# Patient Record
Sex: Male | Born: 1965 | Race: White | Hispanic: No | Marital: Married | Smoking: Never smoker
Health system: Southern US, Community
[De-identification: ages and names within clinical notes are randomized; demographics above are authoritative.]

## PROBLEM LIST (undated history)

## (undated) DIAGNOSIS — Z9889 Other specified postprocedural states: Secondary | ICD-10-CM

## (undated) DIAGNOSIS — R112 Nausea with vomiting, unspecified: Secondary | ICD-10-CM

## (undated) HISTORY — PX: WISDOM TOOTH EXTRACTION: SHX21

## (undated) HISTORY — PX: PILONIDAL CYST EXCISION: SHX744

---

## 2013-09-18 DIAGNOSIS — N508 Other specified disorders of male genital organs: Secondary | ICD-10-CM | POA: Insufficient documentation

## 2013-09-18 DIAGNOSIS — R350 Frequency of micturition: Secondary | ICD-10-CM | POA: Insufficient documentation

## 2013-09-18 DIAGNOSIS — N138 Other obstructive and reflux uropathy: Secondary | ICD-10-CM | POA: Insufficient documentation

## 2013-09-18 DIAGNOSIS — N411 Chronic prostatitis: Secondary | ICD-10-CM | POA: Insufficient documentation

## 2015-09-11 ENCOUNTER — Ambulatory Visit (INDEPENDENT_AMBULATORY_CARE_PROVIDER_SITE_OTHER): Payer: BC Managed Care – PPO | Admitting: Family Medicine

## 2015-09-11 ENCOUNTER — Encounter: Payer: Self-pay | Admitting: Family Medicine

## 2015-09-11 VITALS — BP 120/80 | HR 78 | Ht 69.0 in | Wt 169.0 lb

## 2015-09-11 DIAGNOSIS — Z13220 Encounter for screening for lipoid disorders: Secondary | ICD-10-CM

## 2015-09-11 DIAGNOSIS — Z0000 Encounter for general adult medical examination without abnormal findings: Secondary | ICD-10-CM | POA: Diagnosis not present

## 2015-09-11 LAB — POCT URINALYSIS DIPSTICK
Bilirubin, UA: NEGATIVE
GLUCOSE UA: NEGATIVE
Ketones, UA: NEGATIVE
Leukocytes, UA: NEGATIVE
Nitrite, UA: NEGATIVE
Protein, UA: NEGATIVE
RBC UA: NEGATIVE
UROBILINOGEN UA: 0.2
pH, UA: 5

## 2015-09-11 NOTE — Progress Notes (Signed)
Name: Garrett Barnes   MRN: [id number]    DOB: November 30, 1965   Date:09/11/2015       Progress Note  Subjective  Chief Complaint  Chief Complaint  Patient presents with  . Annual Exam    HPI Comments: Patient presents for annual physical exam.     No problem-specific assessment & plan notes found for this encounter.   History reviewed. No pertinent past medical history.  Past Surgical History  Procedure Laterality Date  . Wisdom tooth extraction    . Pilonidal cyst excision      No family history on file.  Social History   Social History  . Marital Status: Unknown    Spouse Name: N/A  . Number of Children: N/A  . Years of Education: N/A   Occupational History  . Not on file.   Social History Main Topics  . Smoking status: Never Smoker   . Smokeless tobacco: Not on file  . Alcohol Use: No  . Drug Use: No  . Sexual Activity: Not on file   Other Topics Concern  . Not on file   Social History Narrative  . No narrative on file    Allergies  Allergen Reactions  . Penicillins Rash    Diarrhea     Review of Systems  Constitutional: Negative for fever, chills, weight loss and malaise/fatigue.  HENT: Negative for ear discharge, ear pain and sore throat.   Eyes: Negative for blurred vision.  Respiratory: Negative for cough, sputum production, shortness of breath and wheezing.   Cardiovascular: Negative for chest pain, palpitations and leg swelling.  Gastrointestinal: Negative for heartburn, nausea, abdominal pain, diarrhea, constipation, blood in stool and melena.  Genitourinary: Negative for dysuria, urgency, frequency and hematuria.  Musculoskeletal: Negative for myalgias, back pain, joint pain and neck pain.  Skin: Negative for rash.  Neurological: Negative for dizziness, tingling, sensory change, focal weakness and headaches.  Endo/Heme/Allergies: Negative for environmental allergies and polydipsia. Does not bruise/bleed easily.  Psychiatric/Behavioral:  Negative for depression and suicidal ideas. The patient is not nervous/anxious and does not have insomnia.      Objective  Filed Vitals:   09/11/15 0954  BP: 120/80  Pulse: 78  Height: 5\' 9"  (1.753 m)  Weight: 169 lb (76.658 kg)    Physical Exam  Constitutional: He is oriented to person, place, and time and well-developed, well-nourished, and in no distress.  HENT:  Head: Normocephalic.  Right Ear: External ear normal.  Left Ear: External ear normal.  Nose: Nose normal.  Mouth/Throat: Oropharynx is clear and moist.  Eyes: Conjunctivae and EOM are normal. Pupils are equal, round, and reactive to light. Right eye exhibits no discharge. Left eye exhibits no discharge. No scleral icterus.  Neck: Normal range of motion. Neck supple. No JVD present. No tracheal deviation present. No thyromegaly present.  Cardiovascular: Normal rate, regular rhythm, normal heart sounds and intact distal pulses.  Exam reveals no gallop and no friction rub.   No murmur heard. Pulmonary/Chest: Breath sounds normal. No respiratory distress. He has no wheezes. He has no rales.  Abdominal: Soft. Bowel sounds are normal. He exhibits no mass. There is no hepatosplenomegaly. There is no tenderness. There is no rebound, no guarding and no CVA tenderness.  Musculoskeletal: Normal range of motion. He exhibits no edema or tenderness.  Lymphadenopathy:    He has no cervical adenopathy.  Neurological: He is alert and oriented to person, place, and time. He has normal sensation, normal strength, normal reflexes and intact cranial  nerves. No cranial nerve deficit.  Skin: Skin is warm. No rash noted.  Psychiatric: Mood and affect normal.  Nursing note and vitals reviewed.     Assessment & Plan  Problem List Items Addressed This Visit    None    Visit Diagnoses    Annual physical exam    -  Primary    Relevant Orders    Lipid Profile    Renal Function Panel    POCT urinalysis dipstick (Completed)    Screening  cholesterol level        Relevant Orders    Lipid Profile         Dr. Otilio Miu [facility]  09/11/2015

## 2015-09-12 LAB — RENAL FUNCTION PANEL
Albumin: 4.4 g/dL (ref 3.5–5.5)
BUN / CREAT RATIO: 11 (ref 9–20)
BUN: 10 mg/dL (ref 6–24)
CO2: 24 mmol/L (ref 18–29)
CREATININE: 0.92 mg/dL (ref 0.76–1.27)
Calcium: 9.6 mg/dL (ref 8.7–10.2)
Chloride: 98 mmol/L (ref 96–106)
GFR, EST AFRICAN AMERICAN: 114 mL/min/{1.73_m2} (ref >59)
GFR, EST NON AFRICAN AMERICAN: 99 mL/min/{1.73_m2} (ref >59)
Glucose: 70 mg/dL (ref 65–99)
Phosphorus: 3.4 mg/dL (ref 2.5–4.5)
Potassium: 4.3 mmol/L (ref 3.5–5.2)
Sodium: 140 mmol/L (ref 134–144)

## 2015-09-12 LAB — LIPID PANEL
Chol/HDL Ratio: 3.8 ratio units (ref 0.0–5.0)
Cholesterol, Total: 190 mg/dL (ref 100–199)
HDL: 50 mg/dL (ref >39)
LDL CALC: 109 mg/dL — AB (ref 0–99)
TRIGLYCERIDES: 154 mg/dL — AB (ref 0–149)
VLDL Cholesterol Cal: 31 mg/dL (ref 5–40)

## 2016-03-05 ENCOUNTER — Ambulatory Visit
Admission: RE | Admit: 2016-03-05 | Discharge: 2016-03-05 | Disposition: A | Discharge status: Home / Self Care | Payer: BC Managed Care – PPO | Source: Ambulatory Visit | Attending: Family Medicine | Admitting: Family Medicine

## 2016-03-05 ENCOUNTER — Other Ambulatory Visit: Payer: Self-pay | Admitting: *Deleted

## 2016-03-05 ENCOUNTER — Encounter: Payer: Self-pay | Admitting: Family Medicine

## 2016-03-05 ENCOUNTER — Ambulatory Visit (INDEPENDENT_AMBULATORY_CARE_PROVIDER_SITE_OTHER): Payer: BC Managed Care – PPO | Admitting: Family Medicine

## 2016-03-05 VITALS — BP 122/86 | HR 84 | Temp 98.2°F | Wt 175.0 lb

## 2016-03-05 DIAGNOSIS — R0781 Pleurodynia: Secondary | ICD-10-CM

## 2016-03-05 DIAGNOSIS — S29011A Strain of muscle and tendon of front wall of thorax, initial encounter: Secondary | ICD-10-CM | POA: Insufficient documentation

## 2016-03-05 MED ORDER — ETODOLAC 500 MG PO TABS: 500.0000 mg | ORAL_TABLET | Freq: Two times a day (BID) | ORAL | 0 refills | Status: DC

## 2016-03-05 NOTE — Progress Notes (Signed)
Name: Garrett Barnes   MRN: [id number]    DOB: 06-26-1965   Date:03/05/2016       Progress Note  Subjective  Chief Complaint  Chief Complaint  Patient presents with  . Abdominal Pain    pt stated having abdominal lt side for about 1 month    Abdominal Pain  This is a recurrent problem. The current episode started more than 1 month ago. The onset quality is gradual. The problem occurs intermittently. The problem has been waxing and waning. The pain is located in the epigastric region and LUQ. The pain is at a severity of 3/10. The pain is mild. The quality of the pain is aching. The abdominal pain does not radiate. Associated symptoms include belching, constipation and diarrhea. Pertinent negatives include no dysuria, fever, frequency, headaches, hematochezia, hematuria, melena, myalgias, nausea, vomiting or weight loss. The pain is aggravated by certain positions. The pain is relieved by bowel movements. He has tried nothing for the symptoms. The treatment provided no relief. His past medical history is significant for irritable bowel syndrome.    No problem-specific Assessment & Plan notes found for this encounter.   No past medical history on file.  Past Surgical History:  Procedure Laterality Date  . PILONIDAL CYST EXCISION    . WISDOM TOOTH EXTRACTION      No family history on file.  Social History   Social History  . Marital status: Unknown    Spouse name: N/A  . Number of children: N/A  . Years of education: N/A   Occupational History  . Not on file.   Social History Main Topics  . Smoking status: Never Smoker  . Smokeless tobacco: Not on file  . Alcohol use No  . Drug use: No  . Sexual activity: Not on file   Other Topics Concern  . Not on file   Social History Narrative  . No narrative on file    Allergies  Allergen Reactions  . Penicillins Rash    Diarrhea     Review of Systems  Constitutional: Negative for chills, fever, malaise/fatigue and weight  loss.  HENT: Negative for ear discharge, ear pain and sore throat.   Eyes: Negative for blurred vision.  Respiratory: Negative for cough, sputum production, shortness of breath and wheezing.   Cardiovascular: Negative for chest pain, palpitations and leg swelling.  Gastrointestinal: Positive for abdominal pain, constipation and diarrhea. Negative for blood in stool, heartburn, hematochezia, melena, nausea and vomiting.  Genitourinary: Negative for dysuria, frequency, hematuria and urgency.  Musculoskeletal: Negative for back pain, joint pain, myalgias and neck pain.  Skin: Negative for rash.  Neurological: Negative for dizziness, tingling, sensory change, focal weakness and headaches.  Endo/Heme/Allergies: Negative for environmental allergies and polydipsia. Does not bruise/bleed easily.  Psychiatric/Behavioral: Negative for depression and suicidal ideas. The patient is not nervous/anxious and does not have insomnia.      Objective  Vitals:   03/05/16 1421  BP: 122/86  Pulse: 84  Temp: 98.2 F (36.8 C)  SpO2: 99%  Weight: 175 lb (79.4 kg)    Physical Exam  Constitutional: He is oriented to person, place, and time and well-developed, well-nourished, and in no distress.  HENT:  Head: Normocephalic.  Right Ear: External ear normal.  Left Ear: External ear normal.  Nose: Nose normal.  Mouth/Throat: Oropharynx is clear and moist.  Eyes: Conjunctivae and EOM are normal. Pupils are equal, round, and reactive to light. Right eye exhibits no discharge. Left eye exhibits no discharge.  No scleral icterus.  Neck: Normal range of motion. Neck supple. No JVD present. No tracheal deviation present. No thyromegaly present.  Cardiovascular: Normal rate, regular rhythm, normal heart sounds and intact distal pulses.  Exam reveals no gallop and no friction rub.   No murmur heard. Pulmonary/Chest: Breath sounds normal. No respiratory distress. He has no wheezes. He has no rales.  Abdominal: Soft.  Bowel sounds are normal. He exhibits no mass. There is no hepatosplenomegaly. There is no tenderness. There is no rebound, no guarding and no CVA tenderness.  Musculoskeletal: Normal range of motion. He exhibits no edema or tenderness.       Arms: Tenderness/intercostal ?9th/10 th rib area  Lymphadenopathy:    He has no cervical adenopathy.  Neurological: He is alert and oriented to person, place, and time. He has normal sensation, normal strength and intact cranial nerves.  Skin: Skin is warm. No rash noted.  Psychiatric: Mood and affect normal.  Nursing note and vitals reviewed.     Assessment & Plan  Problem List Items Addressed This Visit    None    Visit Diagnoses    Intercostal muscle strain, initial encounter    -  Primary   Relevant Orders   DG Ribs Unilateral Left (Completed)   Rib pain on left side       Relevant Orders   DG Ribs Unilateral Left (Completed)        Dr. Deanna Jones [facility]  03/05/16

## 2016-11-06 ENCOUNTER — Encounter: Payer: BC Managed Care – PPO | Admitting: Family Medicine

## 2016-11-06 ENCOUNTER — Other Ambulatory Visit: Payer: BC Managed Care – PPO

## 2016-11-06 DIAGNOSIS — Z13220 Encounter for screening for lipoid disorders: Secondary | ICD-10-CM

## 2016-11-23 ENCOUNTER — Other Ambulatory Visit: Payer: Self-pay | Admitting: Family Medicine

## 2017-02-02 DIAGNOSIS — Z8042 Family history of malignant neoplasm of prostate: Secondary | ICD-10-CM | POA: Insufficient documentation

## 2017-02-03 DIAGNOSIS — R339 Retention of urine, unspecified: Secondary | ICD-10-CM | POA: Insufficient documentation

## 2017-06-07 ENCOUNTER — Ambulatory Visit: Payer: BC Managed Care – PPO | Admitting: Family Medicine

## 2017-06-07 ENCOUNTER — Other Ambulatory Visit: Payer: Self-pay

## 2017-06-07 ENCOUNTER — Encounter: Payer: Self-pay | Admitting: Family Medicine

## 2017-06-07 VITALS — BP 100/88 | HR 76 | Ht 68.0 in | Wt 174.0 lb

## 2017-06-07 DIAGNOSIS — J0100 Acute maxillary sinusitis, unspecified: Secondary | ICD-10-CM | POA: Diagnosis not present

## 2017-06-07 MED ORDER — GUAIFENESIN-CODEINE 100-10 MG/5ML PO SYRP: 5.0000 mL | ORAL_SOLUTION | Freq: Three times a day (TID) | ORAL | 0 refills | Status: DC | PRN

## 2017-06-07 MED ORDER — AZITHROMYCIN 250 MG PO TABS: ORAL_TABLET | ORAL | 0 refills | Status: DC

## 2017-06-07 NOTE — Progress Notes (Signed)
Name: Garrett Barnes   MRN: [id number]    DOB: 1965/07/28   Date:06/07/2017       Progress Note  Subjective  Chief Complaint  Chief Complaint  Patient presents with  . Sinusitis    had a cold x 1 week- mild fever, headache, sinus pressure, cough    Sinusitis  This is a new problem. The current episode started in the past 7 days. The problem has been gradually improving since onset. There has been no fever. The pain is mild. Associated symptoms include chills, congestion, coughing, headaches, shortness of breath, sinus pressure, sneezing and a sore throat. Pertinent negatives include no diaphoresis, ear pain, hoarse voice, neck pain or swollen glands. (Prod yellow) Past treatments include nothing.  Cough  This is a chronic problem. The current episode started more than 1 year ago. The problem has been waxing and waning. Associated symptoms include chills, headaches, a sore throat and shortness of breath. Pertinent negatives include no chest pain, ear congestion, ear pain, fever, heartburn, hemoptysis, myalgias, nasal congestion, postnasal drip, rash, rhinorrhea, sweats, weight loss or wheezing. He has tried a beta-agonist inhaler for the symptoms. The treatment provided mild relief. There is no history of environmental allergies.    No problem-specific Assessment & Plan notes found for this encounter.   No past medical history on file.  Past Surgical History:  Procedure Laterality Date  . PILONIDAL CYST EXCISION    . WISDOM TOOTH EXTRACTION      No family history on file.  Social History   Socioeconomic History  . Marital status: Unknown    Spouse name: Not on file  . Number of children: Not on file  . Years of education: Not on file  . Highest education level: Not on file  Social Needs  . Financial resource strain: Not on file  . Food insecurity - worry: Not on file  . Food insecurity - inability: Not on file  . Transportation needs - medical: Not on file  . Transportation  needs - non-medical: Not on file  Occupational History  . Not on file  Tobacco Use  . Smoking status: Never Smoker  . Smokeless tobacco: Never Used  Substance and Sexual Activity  . Alcohol use: No    Alcohol/week: 0.0 oz  . Drug use: No  . Sexual activity: Not on file  Other Topics Concern  . Not on file  Social History Narrative  . Not on file    Allergies  Allergen Reactions  . Penicillins Rash    Diarrhea    Outpatient Medications Prior to Visit  Medication Sig Dispense Refill  . Multiple Vitamin (MULTIVITAMIN) capsule Take 1 capsule by mouth daily.    . tadalafil (CIALIS) 5 MG tablet Take 5 mg by mouth.    . triamcinolone (KENALOG) 0.1 % paste     . etodolac (LODINE) 500 MG tablet Take 1 tablet (500 mg total) by mouth 2 (two) times daily. 30 tablet 0  . AFLURIA SUSP      No facility-administered medications prior to visit.     Review of Systems  Constitutional: Positive for chills. Negative for diaphoresis, fever, malaise/fatigue and weight loss.  HENT: Positive for congestion, sinus pressure, sneezing and sore throat. Negative for ear discharge, ear pain, hoarse voice, postnasal drip and rhinorrhea.   Eyes: Negative for blurred vision.  Respiratory: Positive for cough and shortness of breath. Negative for hemoptysis, sputum production and wheezing.   Cardiovascular: Negative for chest pain, palpitations and leg swelling.  Gastrointestinal: Negative for abdominal pain, blood in stool, constipation, diarrhea, heartburn, melena and nausea.  Genitourinary: Negative for dysuria, frequency, hematuria and urgency.  Musculoskeletal: Negative for back pain, joint pain, myalgias and neck pain.  Skin: Negative for rash.  Neurological: Positive for headaches. Negative for dizziness, tingling, sensory change and focal weakness.  Endo/Heme/Allergies: Negative for environmental allergies and polydipsia. Does not bruise/bleed easily.  Psychiatric/Behavioral: Negative for  depression and suicidal ideas. The patient is not nervous/anxious and does not have insomnia.      Objective  Vitals:   06/07/17 1521  BP: 100/88  Pulse: 76  Weight: 174 lb (78.9 kg)  Height: 5\' 8"  (1.727 m)    Physical Exam  Constitutional: He is oriented to person, place, and time and well-developed, well-nourished, and in no distress.  HENT:  Head: Normocephalic.  Right Ear: External ear normal.  Left Ear: External ear normal.  Nose: Nose normal.  Mouth/Throat: Oropharynx is clear and moist.  Eyes: Conjunctivae and EOM are normal. Pupils are equal, round, and reactive to light. Right eye exhibits no discharge. Left eye exhibits no discharge. No scleral icterus.  Neck: Normal range of motion. Neck supple. No JVD present. No tracheal deviation present. No thyromegaly present.  Cardiovascular: Normal rate, regular rhythm, normal heart sounds and intact distal pulses. Exam reveals no gallop and no friction rub.  No murmur heard. Pulmonary/Chest: Breath sounds normal. No respiratory distress. He has no wheezes. He has no rales.  Abdominal: Soft. Bowel sounds are normal. He exhibits no mass. There is no hepatosplenomegaly. There is no tenderness. There is no rebound, no guarding and no CVA tenderness.  Musculoskeletal: Normal range of motion. He exhibits no edema or tenderness.  Lymphadenopathy:    He has no cervical adenopathy.  Neurological: He is alert and oriented to person, place, and time. He has normal sensation, normal strength, normal reflexes and intact cranial nerves. No cranial nerve deficit.  Skin: Skin is warm. No rash noted.  Psychiatric: Mood and affect normal.  Nursing note and vitals reviewed.     Assessment & Plan  Problem List Items Addressed This Visit    None    Visit Diagnoses    Acute maxillary sinusitis, recurrence not specified    -  Primary   Relevant Medications   azithromycin (ZITHROMAX) 250 MG tablet   guaiFENesin-codeine (ROBITUSSIN AC)  100-10 MG/5ML syrup      Meds ordered this encounter  Medications  . azithromycin (ZITHROMAX) 250 MG tablet    Sig: 2 today then 1 a day for 4 days    Dispense:  6 tablet    Refill:  0  . guaiFENesin-codeine (ROBITUSSIN AC) 100-10 MG/5ML syrup    Sig: Take 5 mLs by mouth 3 (three) times daily as needed for cough.    Dispense:  100 mL    Refill:  0      Dr. Otilio Miu [facility]  06/07/17

## 2017-10-11 ENCOUNTER — Other Ambulatory Visit: Payer: Self-pay

## 2017-10-11 DIAGNOSIS — Z1211 Encounter for screening for malignant neoplasm of colon: Secondary | ICD-10-CM

## 2017-10-13 ENCOUNTER — Telehealth: Payer: Self-pay | Admitting: Gastroenterology

## 2017-10-13 NOTE — Telephone Encounter (Signed)
Gastroenterology Pre-Procedure Review  Request Date: 11/10/17   [facility]   Requesting Physician: Dr. Marius Ditch   PATIENT REVIEW QUESTIONS: The patient responded to the following health history questions as indicated:    1. Are you having any GI issues? no 2. Do you have a personal history of Polyps? no 3. Do you have a family history of Colon Cancer or Polyps? yes (Grandmother w/ colon cancer) 4. Diabetes Mellitus? no 5. Joint replacements in the past 12 months?no 6. Major health problems in the past 3 months?no 7. Any artificial heart valves, MVP, or defibrillator?no    MEDICATIONS & ALLERGIES:    Patient reports the following regarding taking any anticoagulation/antiplatelet therapy:   Plavix, Coumadin, Eliquis, Xarelto, Lovenox, Pradaxa, Brilinta, or Effient? no Aspirin? no  Allergies to Latex and Penicillin  Patient confirms/reports the following medications:  Current Outpatient Medications  Medication Sig Dispense Refill  . azithromycin (ZITHROMAX) 250 MG tablet 2 today then 1 a day for 4 days 6 tablet 0  . guaiFENesin-codeine (ROBITUSSIN AC) 100-10 MG/5ML syrup Take 5 mLs by mouth 3 (three) times daily as needed for cough. 100 mL 0  . Multiple Vitamin (MULTIVITAMIN) capsule Take 1 capsule by mouth daily.    . tadalafil (CIALIS) 5 MG tablet Take 5 mg by mouth.    . triamcinolone (KENALOG) 0.1 % paste      No current facility-administered medications for this visit.     Patient confirms/reports the following allergies:  Allergies  Allergen Reactions  . Penicillins Rash    Diarrhea    No orders of the defined types were placed in this encounter.   AUTHORIZATION INFORMATION Primary Insurance: 1D#: Group #:  Secondary Insurance: 1D#: Group #:  SCHEDULE INFORMATION: Date: 11/10/17  Time: Location:

## 2017-10-15 ENCOUNTER — Other Ambulatory Visit: Payer: Self-pay

## 2017-10-15 DIAGNOSIS — Z1211 Encounter for screening for malignant neoplasm of colon: Secondary | ICD-10-CM

## 2017-11-04 ENCOUNTER — Other Ambulatory Visit: Payer: Self-pay

## 2017-11-08 ENCOUNTER — Ambulatory Visit (INDEPENDENT_AMBULATORY_CARE_PROVIDER_SITE_OTHER): Payer: BC Managed Care – PPO | Admitting: Family Medicine

## 2017-11-08 ENCOUNTER — Encounter: Payer: Self-pay | Admitting: Family Medicine

## 2017-11-08 VITALS — BP 120/88 | HR 68 | Ht 68.0 in | Wt 178.0 lb

## 2017-11-08 DIAGNOSIS — Z0000 Encounter for general adult medical examination without abnormal findings: Secondary | ICD-10-CM

## 2017-11-08 DIAGNOSIS — E782 Mixed hyperlipidemia: Secondary | ICD-10-CM | POA: Diagnosis not present

## 2017-11-08 LAB — POCT URINALYSIS DIPSTICK
Appearance: NORMAL
BILIRUBIN UA: NEGATIVE
Glucose, UA: NEGATIVE
KETONES UA: NEGATIVE
Leukocytes, UA: NEGATIVE
NITRITE UA: NEGATIVE
ODOR: NEGATIVE
PH UA: 6.5 (ref 5.0–8.0)
PROTEIN UA: NEGATIVE
RBC UA: NEGATIVE
SPEC GRAV UA: 1.01 (ref 1.010–1.025)
UROBILINOGEN UA: NEGATIVE U/dL — AB

## 2017-11-08 LAB — HEMOCCULT GUIAC POC 1CARD (OFFICE): FECAL OCCULT BLD: NEGATIVE

## 2017-11-08 NOTE — Progress Notes (Signed)
Name: Garrett Barnes   MRN: [id number]    DOB: 08/18/1965   Date:11/08/2017       Progress Note  Subjective  Chief Complaint  Chief Complaint  Patient presents with  . Annual Exam    Patient is a 52 year old male who presents for a comprehensive physical exam. The patient reports the following problems: none. Health maintenance has been reviewed colonoscopy pending/ tetanus to follow procedure.   No problem-specific Assessment & Plan notes found for this encounter.   Past Medical History:  Diagnosis Date  . PONV (postoperative nausea and vomiting)     Past Surgical History:  Procedure Laterality Date  . PILONIDAL CYST EXCISION    . WISDOM TOOTH EXTRACTION      No family history on file.  Social History   Socioeconomic History  . Marital status: Unknown    Spouse name: Not on file  . Number of children: Not on file  . Years of education: Not on file  . Highest education level: Not on file  Occupational History  . Not on file  Social Needs  . Financial resource strain: Not on file  . Food insecurity:    Worry: Not on file    Inability: Not on file  . Transportation needs:    Medical: Not on file    Non-medical: Not on file  Tobacco Use  . Smoking status: Never Smoker  . Smokeless tobacco: Never Used  Substance and Sexual Activity  . Alcohol use: No    Alcohol/week: 0.0 standard drinks  . Drug use: No  . Sexual activity: Not on file  Lifestyle  . Physical activity:    Days per week: Not on file    Minutes per session: Not on file  . Stress: Not on file  Relationships  . Social connections:    Talks on phone: Not on file    Gets together: Not on file    Attends religious service: Not on file    Active member of club or organization: Not on file    Attends meetings of clubs or organizations: Not on file    Relationship status: Not on file  . Intimate partner violence:    Fear of current or ex partner: Not on file    Emotionally abused: Not on file   Physically abused: Not on file    Forced sexual activity: Not on file  Other Topics Concern  . Not on file  Social History Narrative  . Not on file    Allergies  Allergen Reactions  . Latex Hives  . Penicillins Rash    Diarrhea    Outpatient Medications Prior to Visit  Medication Sig Dispense Refill  . Multiple Vitamin (MULTIVITAMIN) capsule Take 1 capsule by mouth daily.    . tadalafil (CIALIS) 5 MG tablet Take 5 mg by mouth.    . triamcinolone (KENALOG) 0.1 % paste     . azithromycin (ZITHROMAX) 250 MG tablet 2 today then 1 a day for 4 days (Patient not taking: Reported on 11/04/2017) 6 tablet 0  . guaiFENesin-codeine (ROBITUSSIN AC) 100-10 MG/5ML syrup Take 5 mLs by mouth 3 (three) times daily as needed for cough. (Patient not taking: Reported on 11/04/2017) 100 mL 0   No facility-administered medications prior to visit.     Review of Systems  Constitutional: Negative for chills, fever, malaise/fatigue and weight loss.  HENT: Negative for ear discharge, ear pain and sore throat.   Eyes: Negative for blurred vision.  Respiratory:  Negative for cough, sputum production, shortness of breath and wheezing.   Cardiovascular: Negative for chest pain, palpitations and leg swelling.  Gastrointestinal: Negative for abdominal pain, blood in stool, constipation, diarrhea, heartburn, melena and nausea.  Genitourinary: Negative for dysuria, frequency, hematuria and urgency.  Musculoskeletal: Negative for back pain, joint pain, myalgias and neck pain.  Skin: Negative for rash.  Neurological: Negative for dizziness, tingling, sensory change, focal weakness and headaches.  Endo/Heme/Allergies: Negative for environmental allergies and polydipsia. Does not bruise/bleed easily.  Psychiatric/Behavioral: Negative for depression and suicidal ideas. The patient is not nervous/anxious and does not have insomnia.      Objective  Vitals:   11/08/17 0930  BP: 120/88  Pulse: 68  Weight: 178 lb (80.7  kg)  Height: 5\' 8"  (1.727 m)    Physical Exam  Constitutional: He is oriented to person, place, and time. He appears well-developed and well-nourished.  HENT:  Head: Normocephalic and atraumatic.  Right Ear: Hearing, tympanic membrane, external ear and ear canal normal.  Left Ear: Hearing, tympanic membrane, external ear and ear canal normal.  Nose: Nose normal.  Mouth/Throat: Uvula is midline and oropharynx is clear and moist. No oropharyngeal exudate, posterior oropharyngeal edema or posterior oropharyngeal erythema.  Eyes: Pupils are equal, round, and reactive to light. Conjunctivae, EOM and lids are normal. Right eye exhibits no discharge. Left eye exhibits no discharge. No scleral icterus.  Fundoscopic exam:      The right eye shows no AV nicking.       The left eye shows no AV nicking.  Neck: Normal range of motion and phonation normal. Neck supple. Normal carotid pulses, no hepatojugular reflux and no JVD present. Carotid bruit is not present. No tracheal deviation present. No thyroid mass and no thyromegaly present.  Cardiovascular: Normal rate, regular rhythm, S1 normal, S2 normal, normal heart sounds, intact distal pulses and normal pulses. PMI is not displaced. Exam reveals no gallop, no S3, no S4 and no friction rub.  No murmur heard. Pulses:      Carotid pulses are 2+ on the right side, and 2+ on the left side.      Radial pulses are 2+ on the right side, and 2+ on the left side.       Femoral pulses are 2+ on the right side, and 2+ on the left side.      Popliteal pulses are 2+ on the right side, and 2+ on the left side.       Dorsalis pedis pulses are 2+ on the right side, and 2+ on the left side.       Posterior tibial pulses are 2+ on the right side, and 2+ on the left side.  Pulmonary/Chest: Effort normal and breath sounds normal. No stridor. No respiratory distress. He has no decreased breath sounds. He has no wheezes. He has no rhonchi. He has no rales.  Abdominal:  Soft. Normal appearance, normal aorta and bowel sounds are normal. He exhibits no mass. There is no hepatosplenomegaly. There is no tenderness. There is no rebound, no guarding and no CVA tenderness.  Genitourinary: Rectum normal and prostate normal. Rectal exam shows guaiac negative stool. Right testis shows no mass, no swelling and no tenderness. Left testis shows no mass, no swelling and no tenderness. Circumcised.  Musculoskeletal: Normal range of motion. He exhibits no edema or tenderness.  Lymphadenopathy:       Head (right side): No submental and no submandibular adenopathy present.  Head (left side): No submental and no submandibular adenopathy present.    He has no cervical adenopathy.    He has no axillary adenopathy.  Neurological: He is alert and oriented to person, place, and time. He has normal strength and normal reflexes. No cranial nerve deficit or sensory deficit.  Reflex Scores:      Tricep reflexes are 2+ on the right side and 2+ on the left side.      Bicep reflexes are 2+ on the right side and 2+ on the left side.      Brachioradialis reflexes are 2+ on the right side and 2+ on the left side.      Patellar reflexes are 2+ on the right side and 2+ on the left side.      Achilles reflexes are 2+ on the right side and 2+ on the left side. Skin: Skin is warm. No rash noted.  Nursing note and vitals reviewed.     Assessment & Plan  Problem List Items Addressed This Visit    None    Visit Diagnoses    Annual physical exam    -  Primary   No subjective/objective concerns. Maintenance labs obtained.   Relevant Orders   POCT occult blood stool (Completed)   POCT urinalysis dipstick (Completed)   Renal Function Panel   Lipid panel   Mixed hyperlipidemia       Chronic. Controlled. Recent weight gain. Check lipid panel. Diet pending.      No orders of the defined types were placed in this encounter.     Dr. Macon [facility]  11/08/17

## 2017-11-09 LAB — RENAL FUNCTION PANEL
ALBUMIN: 4.5 g/dL (ref 3.5–5.5)
BUN/Creatinine Ratio: 12 (ref 9–20)
BUN: 12 mg/dL (ref 6–24)
CHLORIDE: 98 mmol/L (ref 96–106)
CO2: 23 mmol/L (ref 20–29)
Calcium: 9.4 mg/dL (ref 8.7–10.2)
Creatinine, Ser: 0.97 mg/dL (ref 0.76–1.27)
GFR calc non Af Amer: 91 mL/min/{1.73_m2} (ref >59)
GFR, EST AFRICAN AMERICAN: 105 mL/min/{1.73_m2} (ref >59)
Glucose: 70 mg/dL (ref 65–99)
PHOSPHORUS: 2.7 mg/dL (ref 2.5–4.5)
POTASSIUM: 3.6 mmol/L (ref 3.5–5.2)
Sodium: 136 mmol/L (ref 134–144)

## 2017-11-09 LAB — LIPID PANEL
Chol/HDL Ratio: 4.4 ratio (ref 0.0–5.0)
Cholesterol, Total: 200 mg/dL — ABNORMAL HIGH (ref 100–199)
HDL: 45 mg/dL (ref >39)
LDL Calculated: 130 mg/dL — ABNORMAL HIGH (ref 0–99)
TRIGLYCERIDES: 127 mg/dL (ref 0–149)
VLDL CHOLESTEROL CAL: 25 mg/dL (ref 5–40)

## 2017-11-10 ENCOUNTER — Ambulatory Visit: Payer: BC Managed Care – PPO | Admitting: Anesthesiology

## 2017-11-10 ENCOUNTER — Encounter: Payer: Self-pay | Admitting: *Deleted

## 2017-11-10 ENCOUNTER — Encounter
Admission: RE | Disposition: A | Discharge status: Home / Self Care | Payer: Self-pay | Source: Ambulatory Visit | Attending: Gastroenterology

## 2017-11-10 ENCOUNTER — Ambulatory Visit
Admission: RE | Admit: 2017-11-10 | Discharge: 2017-11-10 | Disposition: A | Discharge status: Home / Self Care | Payer: BC Managed Care – PPO | Source: Ambulatory Visit | Attending: Gastroenterology | Admitting: Gastroenterology

## 2017-11-10 DIAGNOSIS — Z1211 Encounter for screening for malignant neoplasm of colon: Secondary | ICD-10-CM | POA: Diagnosis present

## 2017-11-10 DIAGNOSIS — D122 Benign neoplasm of ascending colon: Secondary | ICD-10-CM | POA: Diagnosis not present

## 2017-11-10 HISTORY — DX: Other specified postprocedural states: Z98.890

## 2017-11-10 HISTORY — DX: Nausea with vomiting, unspecified: R11.2

## 2017-11-10 HISTORY — PX: COLONOSCOPY WITH PROPOFOL: SHX5780

## 2017-11-10 SURGERY — COLONOSCOPY WITH PROPOFOL
Anesthesia: General | Wound class: Clean Contaminated

## 2017-11-10 MED ORDER — LACTATED RINGERS IV SOLN
1000.0000 mL | INTRAVENOUS | Status: DC
  Administered 2017-11-10: 1000 mL via INTRAVENOUS

## 2017-11-10 MED ORDER — PROPOFOL 10 MG/ML IV BOLUS
INTRAVENOUS | Status: DC | PRN
  Administered 2017-11-10: 50 mg via INTRAVENOUS
  Administered 2017-11-10: 40 mg via INTRAVENOUS
  Administered 2017-11-10: 150 mg via INTRAVENOUS
  Administered 2017-11-10: 50 mg via INTRAVENOUS
  Administered 2017-11-10 (×2): 20 mg via INTRAVENOUS

## 2017-11-10 MED ORDER — SODIUM CHLORIDE 0.9 % IV SOLN: INTRAVENOUS | Status: DC

## 2017-11-10 MED ORDER — STERILE WATER FOR IRRIGATION IR SOLN
Status: DC | PRN
  Administered 2017-11-10: 10:00:00

## 2017-11-10 MED ORDER — LIDOCAINE HCL (CARDIAC) PF 100 MG/5ML IV SOSY
PREFILLED_SYRINGE | INTRAVENOUS | Status: DC | PRN
  Administered 2017-11-10: 30 mg via INTRAVENOUS

## 2017-11-10 SURGICAL SUPPLY — 27 items
CANISTER SUCT 1200ML W/VALVE (MISCELLANEOUS) ×3 IMPLANT
CLIP HMST 235XBRD CATH ROT (MISCELLANEOUS) IMPLANT
CLIP RESOLUTION 360 11X235 (MISCELLANEOUS)
COLOWRAP LRG (MISCELLANEOUS) ×3
COMPRESSION COLOWRAP LRG (MISCELLANEOUS) ×1 IMPLANT
ELECT REM PT RETURN 9FT ADLT (ELECTROSURGICAL)
ELECTRODE REM PT RTRN 9FT ADLT (ELECTROSURGICAL) IMPLANT
FCP ESCP3.2XJMB 240X2.8X (MISCELLANEOUS)
FORCEPS BIOP RAD 4 LRG CAP 4 (CUTTING FORCEPS) IMPLANT
FORCEPS BIOP RJ4 240 W/NDL (MISCELLANEOUS)
FORCEPS ESCP3.2XJMB 240X2.8X (MISCELLANEOUS) IMPLANT
GOWN CVR UNV OPN BCK APRN NK (MISCELLANEOUS) ×2 IMPLANT
GOWN ISOL THUMB LOOP REG UNIV (MISCELLANEOUS) ×4
INJECTOR VARIJECT VIN23 (MISCELLANEOUS) IMPLANT
KIT DEFENDO VALVE AND CONN (KITS) IMPLANT
KIT ENDO PROCEDURE OLY (KITS) ×3 IMPLANT
MARKER SPOT ENDO TATTOO 5ML (MISCELLANEOUS) IMPLANT
PROBE APC STR FIRE (PROBE) IMPLANT
RETRIEVER NET ROTH 2.5X230 LF (MISCELLANEOUS) IMPLANT
SNARE COLD EXACTO (MISCELLANEOUS) ×3 IMPLANT
SNARE SHORT THROW 13M SML OVAL (MISCELLANEOUS) ×3 IMPLANT
SNARE SHORT THROW 30M LRG OVAL (MISCELLANEOUS) IMPLANT
SNARE SNG USE RND 15MM (INSTRUMENTS) IMPLANT
SPOT EX ENDOSCOPIC TATTOO (MISCELLANEOUS)
TRAP ETRAP POLY (MISCELLANEOUS) ×3 IMPLANT
VARIJECT INJECTOR VIN23 (MISCELLANEOUS)
WATER STERILE IRR 250ML POUR (IV SOLUTION) ×3 IMPLANT

## 2017-11-10 NOTE — Anesthesia Procedure Notes (Signed)
Performed by: Rebecca Motta, CRNA       

## 2017-11-10 NOTE — Anesthesia Postprocedure Evaluation (Signed)
Anesthesia Post Note  Patient: Garrett Barnes  Procedure(s) Performed: COLONOSCOPY WITH PROPOFOL (N/A )  Patient location during evaluation: PACU Anesthesia Type: General Level of consciousness: awake Pain management: pain level controlled Vital Signs Assessment: post-procedure vital signs reviewed and stable Respiratory status: spontaneous breathing Cardiovascular status: blood pressure returned to baseline Postop Assessment: no headache Anesthetic complications: no    Lavonna Monarch

## 2017-11-10 NOTE — Anesthesia Preprocedure Evaluation (Signed)
Anesthesia Evaluation  Patient identified by MRN, date of birth, ID band Patient awake    Reviewed: Allergy & Precautions, NPO status , Patient's Chart, lab work & pertinent test results, reviewed documented beta blocker date and time   History of Anesthesia Complications (+) PONV and history of anesthetic complications (PONV POD 1 from dental procedure, likely narcotic related)  Airway Mallampati: II  TM Distance: >3 FB Neck ROM: Full    Dental no notable dental hx.    Pulmonary neg pulmonary ROS,    Pulmonary exam normal breath sounds clear to auscultation       Cardiovascular negative cardio ROS Normal cardiovascular exam Rhythm:Regular Rate:Normal     Neuro/Psych negative neurological ROS  negative psych ROS   GI/Hepatic negative GI ROS, Neg liver ROS,   Endo/Other  negative endocrine ROS  Renal/GU negative Renal ROS  negative genitourinary   Musculoskeletal negative musculoskeletal ROS (+)   Abdominal Normal abdominal exam  (+)   Peds  Hematology negative hematology ROS (+)   Anesthesia Other Findings   Reproductive/Obstetrics                             Anesthesia Physical Anesthesia Plan  ASA: I  Anesthesia Plan: General   Post-op Pain Management:    Induction: Intravenous  PONV Risk Score and Plan:   Airway Management Planned: Natural Airway  Additional Equipment: None  Intra-op Plan:   Post-operative Plan:   Informed Consent: I have reviewed the patients History and Physical, chart, labs and discussed the procedure including the risks, benefits and alternatives for the proposed anesthesia with the patient or authorized representative who has indicated his/her understanding and acceptance.     Plan Discussed with: Anesthesiologist, CRNA and Surgeon  Anesthesia Plan Comments:         Anesthesia Quick Evaluation

## 2017-11-10 NOTE — Op Note (Signed)
[facility] Patient Name: Garrett Barnes Procedure Date: 11/10/2017 9:40 AM MRN: [id number] Account #: [id number] Date of Birth: 03-Jul-1965 Admit Type: Outpatient Age: 52 Room: [facility] OR ROOM 01 Gender: Male Note Status: Finalized Procedure:            Colonoscopy Indications:          Screening for colorectal malignant neoplasm, This is                        the patient's first colonoscopy Providers:            Lin Landsman MD, MD Referring MD:         Juline Patch, MD (Referring MD) Medicines:            Monitored Anesthesia Care Complications:        No immediate complications. Estimated blood loss: None. Procedure:            Pre-Anesthesia Assessment:                       - Prior to the procedure, a History and Physical was                        performed, and patient medications and allergies were                        reviewed. The patient is competent. The risks and                        benefits of the procedure and the sedation options and                        risks were discussed with the patient. All questions                        were answered and informed consent was obtained.                        Patient identification and proposed procedure were                        verified by the physician, the nurse, the                        anesthesiologist, the anesthetist and the technician in                        the pre-procedure area in the procedure room in the                        endoscopy suite. Mental Status Examination: alert and                        oriented. Airway Examination: normal oropharyngeal                        airway and neck mobility. Respiratory Examination:                        clear to auscultation. CV Examination: normal.  Prophylactic Antibiotics: The patient does not require                        prophylactic antibiotics. Prior Anticoagulants: The        patient has taken no previous anticoagulant or                        antiplatelet agents. ASA Grade Assessment: I - A                        normal, healthy patient. After reviewing the risks and                        benefits, the patient was deemed in satisfactory                        condition to undergo the procedure. The anesthesia plan                        was to use monitored anesthesia care (MAC). Immediately                        prior to administration of medications, the patient was                        re-assessed for adequacy to receive sedatives. The                        heart rate, respiratory rate, oxygen saturations, blood                        pressure, adequacy of pulmonary ventilation, and                        response to care were monitored throughout the                        procedure. The physical status of the patient was                        re-assessed after the procedure.                       After obtaining informed consent, the colonoscope was                        passed under direct vision. Throughout the procedure,                        the patient's blood pressure, pulse, and oxygen                        saturations were monitored continuously. The was                        introduced through the anus and advanced to the the                        terminal ileum. The colonoscopy was performed without  difficulty. The patient tolerated the procedure well.                        The quality of the bowel preparation was evaluated                        using the BBPS [facility] Bowel Preparation Scale) with                        scores of: Right Colon = 3, Transverse Colon = 3 and                        Left Colon = 3 (entire mucosa seen well with no                        residual staining, small fragments of stool or opaque                        liquid). The total BBPS score equals 9. Findings:      The  perianal and digital rectal examinations were normal. Pertinent       negatives include normal sphincter tone and no palpable rectal lesions.      The terminal ileum appeared normal.      Two sessile polyps were found in the ascending colon. The polyps were 6       mm in size. These polyps were removed with a hot snare. Resection and       retrieval were complete.      A 5 mm polyp was found in the ascending colon. The polyp was sessile.       The polyp was removed with a cold snare. Resection and retrieval were       complete.      The retroflexed view of the distal rectum and anal verge was normal and       showed no anal or rectal abnormalities.      The exam was otherwise without abnormality. Impression:           - The examined portion of the ileum was normal.                       - Two 6 mm polyps in the ascending colon, removed with                        a hot snare. Resected and retrieved.                       - One 5 mm polyp in the ascending colon, removed with a                        cold snare. Resected and retrieved.                       - The distal rectum and anal verge are normal on                        retroflexion view.                       - The examination was otherwise normal. Recommendation:       -  Discharge patient to home (with spouse).                       - Resume regular diet today.                       - Continue present medications.                       - Await pathology results.                       - Repeat colonoscopy in 3 years for surveillance of                        multiple polyps. Procedure Code(s):    --- Professional ---                       [phone], Colonoscopy, flexible; with removal of tumor(s),                        polyp(s), or other lesion(s) by snare technique Diagnosis Code(s):    --- Professional ---                       D12.2, Benign neoplasm of ascending colon                       Z12.11, Encounter for screening for  malignant neoplasm                        of colon CPT copyright 2017 American Medical Association. All rights reserved. The codes documented in this report are preliminary and upon coder review may  be revised to meet current compliance requirements. Dr. Ulyess Mort Lin Landsman MD, MD 11/10/2017 10:22:09 AM This report has been signed electronically. Number of Addenda: 0 Note Initiated On: 11/10/2017 9:40 AM Scope Withdrawal Time: 0 hours 16 minutes 33 seconds  Total Procedure Duration: 0 hours 18 minutes 11 seconds       [facility]

## 2017-11-10 NOTE — Transfer of Care (Signed)
Immediate Anesthesia Transfer of Care Note  Patient: Garrett Barnes  Procedure(s) Performed: COLONOSCOPY WITH PROPOFOL (N/A )  Patient Location: PACU  Anesthesia Type: General  Level of Consciousness: awake, alert  and patient cooperative  Airway and Oxygen Therapy: Patient Spontanous Breathing and Patient connected to supplemental oxygen  Post-op Assessment: Post-op Vital signs reviewed, Patient's Cardiovascular Status Stable, Respiratory Function Stable, Patent Airway and No signs of Nausea or vomiting  Post-op Vital Signs: Reviewed and stable  Complications: No apparent anesthesia complications

## 2017-11-10 NOTE — H&P (Signed)
Garrett Darby, MD [address]  [place], [place] [address]  Main: [phone]  Fax: [phone] Pager: [phone]  Primary Care Physician:  Juline Patch, MD Primary Gastroenterologist:  Dr. Cephas Barnes  Pre-Procedure History & Physical: HPI:  Garrett Barnes is a 52 y.o. male is here for an colonoscopy.   Past Medical History:  Diagnosis Date  . PONV (postoperative nausea and vomiting)     Past Surgical History:  Procedure Laterality Date  . PILONIDAL CYST EXCISION    . WISDOM TOOTH EXTRACTION      Prior to Admission medications   Medication Sig Start Date End Date Taking? Authorizing Provider  Multiple Vitamin (MULTIVITAMIN) capsule Take 1 capsule by mouth daily.    [provider]  tadalafil (CIALIS) 5 MG tablet Take 5 mg by mouth. 09/17/15   [provider]  triamcinolone (KENALOG) 0.1 % paste  12/09/15   [provider]    Allergies as of 10/15/2017 - Review Complete 06/07/2017  Allergen Reaction Noted  . Penicillins Rash 09/11/2015    History reviewed. No pertinent family history.  Social History   Socioeconomic History  . Marital status: Unknown    Spouse name: Not on file  . Number of children: Not on file  . Years of education: Not on file  . Highest education level: Not on file  Occupational History  . Not on file  Social Needs  . Financial resource strain: Not on file  . Food insecurity:    Worry: Not on file    Inability: Not on file  . Transportation needs:    Medical: Not on file    Non-medical: Not on file  Tobacco Use  . Smoking status: Never Smoker  . Smokeless tobacco: Never Used  Substance and Sexual Activity  . Alcohol use: No    Alcohol/week: 0.0 standard drinks  . Drug use: No  . Sexual activity: Not on file  Lifestyle  . Physical activity:    Days per week: Not on file    Minutes per session: Not on file  . Stress: Not on file  Relationships  . Social connections:    Talks  on phone: Not on file    Gets together: Not on file    Attends religious service: Not on file    Active member of club or organization: Not on file    Attends meetings of clubs or organizations: Not on file    Relationship status: Not on file  . Intimate partner violence:    Fear of current or ex partner: Not on file    Emotionally abused: Not on file    Physically abused: Not on file    Forced sexual activity: Not on file  Other Topics Concern  . Not on file  Social History Narrative  . Not on file    Review of Systems: See HPI, otherwise negative ROS  Physical Exam: BP 116/86   Pulse 89   Temp 98.1 F (36.7 C) (Tympanic)   Resp 16   Ht 5\' 8"  (1.727 m)   Wt 78.5 kg   SpO2 100%   BMI 26.30 kg/m  General:   Alert,  pleasant and cooperative in NAD Head:  Normocephalic and atraumatic. Neck:  Supple; no masses or thyromegaly. Lungs:  Clear throughout to auscultation.    Heart:  Regular rate and rhythm. Abdomen:  Soft, nontender and nondistended. Normal bowel sounds, without guarding, and without rebound.   Neurologic:  Alert  and  oriented x4;  grossly normal neurologically.  Impression/Plan: [name] is here for an colonoscopy to be performed for colon cancer screening  Risks, benefits, limitations, and alternatives regarding  colonoscopy have been reviewed with the patient.  Questions have been answered.  All parties agreeable.   Sherri Sear, MD  11/10/2017, 9:01 AM

## 2017-11-11 ENCOUNTER — Encounter: Payer: Self-pay | Admitting: Gastroenterology

## 2017-11-12 ENCOUNTER — Encounter: Payer: Self-pay | Admitting: Gastroenterology

## 2019-01-21 ENCOUNTER — Other Ambulatory Visit: Payer: Self-pay | Admitting: Family Medicine

## 2019-03-13 ENCOUNTER — Other Ambulatory Visit: Payer: Self-pay

## 2019-03-13 MED ORDER — TRIAMCINOLONE ACETONIDE 0.1 % MT PSTE: PASTE | Freq: Every day | OROMUCOSAL | 0 refills | Status: DC

## 2019-03-13 NOTE — Progress Notes (Unsigned)
Sent in dental paste

## 2019-06-23 DIAGNOSIS — N528 Other male erectile dysfunction: Secondary | ICD-10-CM | POA: Insufficient documentation

## 2019-08-02 ENCOUNTER — Encounter: Payer: Self-pay | Admitting: Family Medicine

## 2019-08-02 ENCOUNTER — Ambulatory Visit (INDEPENDENT_AMBULATORY_CARE_PROVIDER_SITE_OTHER): Payer: BC Managed Care – PPO | Admitting: Family Medicine

## 2019-08-02 ENCOUNTER — Other Ambulatory Visit: Payer: Self-pay

## 2019-08-02 VITALS — BP 120/80 | HR 84 | Ht 68.0 in | Wt 183.0 lb

## 2019-08-02 DIAGNOSIS — E782 Mixed hyperlipidemia: Secondary | ICD-10-CM

## 2019-08-02 DIAGNOSIS — M722 Plantar fascial fibromatosis: Secondary | ICD-10-CM | POA: Diagnosis not present

## 2019-08-02 DIAGNOSIS — R5383 Other fatigue: Secondary | ICD-10-CM | POA: Diagnosis not present

## 2019-08-02 DIAGNOSIS — Z0000 Encounter for general adult medical examination without abnormal findings: Secondary | ICD-10-CM | POA: Diagnosis not present

## 2019-08-02 DIAGNOSIS — K582 Mixed irritable bowel syndrome: Secondary | ICD-10-CM | POA: Diagnosis not present

## 2019-08-02 NOTE — Patient Instructions (Addendum)
Irritable Bowel Syndrome, Adult  Irritable bowel syndrome (IBS) is a group of symptoms that affects the organs responsible for digestion (gastrointestinal or GI tract). IBS is not one specific disease. To regulate how the GI tract works, the body sends signals back and forth between the intestines and the brain. If you have IBS, there may be a problem with these signals. As a result, the GI tract does not function normally. The intestines may become more sensitive and overreact to certain things. This may be especially true when you eat certain foods or when you are under stress. There are four types of IBS. These may be determined based on the consistency of your stool (feces):  IBS with diarrhea.  IBS with constipation.  Mixed IBS.  Unsubtyped IBS. It is important to know which type of IBS you have. Certain treatments are more likely to be helpful for certain types of IBS. What are the causes? The exact cause of IBS is not known. What increases the risk? You may have a higher risk for IBS if you:  Are male.  Are younger than 40.  Have a family history of IBS.  Have a mental health condition, such as depression, anxiety, or post-traumatic stress disorder.  Have had a bacterial infection of your GI tract. What are the signs or symptoms? Symptoms of IBS vary from person to person. The main symptom is abdominal pain or discomfort. Other symptoms usually include one or more of the following:  Diarrhea, constipation, or both.  Abdominal swelling or bloating.  Feeling full after eating a small or regular-sized meal.  Frequent gas.  Mucus in the stool.  A feeling of having more stool left after a bowel movement. Symptoms tend to come and go. They may be triggered by stress, mental health conditions, or certain foods. How is this diagnosed? This condition may be diagnosed based on a physical exam, your medical history, and your symptoms. You may have tests, such as:  Blood  tests.  Stool test.  X-rays.  CT scan.  Colonoscopy. This is a procedure in which your GI tract is viewed with a long, thin, flexible tube. How is this treated? There is no cure for IBS, but treatment can help relieve symptoms. Treatment depends on the type of IBS you have, and may include:  Changes to your diet, such as: ? Avoiding foods that cause symptoms. ? Drinking more water. ? Following a low-FODMAP (fermentable oligosaccharides, disaccharides, monosaccharides, and polyols) diet for up to 6 weeks, or as told by your health care provider. FODMAPs are sugars that are hard for some people to digest. ? Eating more fiber. ? Eating medium-sized meals at the same times every day.  Medicines. These may include: ? Fiber supplements, if you have constipation. ? Medicine to control diarrhea (antidiarrheal medicines). ? Medicine to help control muscle tightening (spasms) in your GI tract (antispasmodic medicines). ? Medicines to help with mental health conditions, such as antidepressants or tranquilizers.  Talk therapy or counseling.  Working with a diet and nutrition specialist (dietitian) to help create a food plan that is right for you.  Managing your stress. Follow these instructions at home: Eating and drinking  Eat a healthy diet.  Eat medium-sized meals at about the same time every day. Do not eat large meals.  Gradually eat more fiber-rich foods. These include whole grains, fruits, and vegetables. This may be especially helpful if you have IBS with constipation.  Eat a diet low in FODMAPs.  Drink enough   fluid to keep your urine pale yellow.  Keep a journal of foods that seem to trigger symptoms.  Avoid foods and drinks that: ? Contain added sugar. ? Make your symptoms worse. Dairy products, caffeinated drinks, and carbonated drinks can make symptoms worse for some people. General instructions  Take over-the-counter and prescription medicines and supplements only  as told by your health care provider.  Get enough exercise. Do at least 150 minutes of moderate-intensity exercise each week.  Manage your stress. Getting enough sleep and exercise can help you manage stress.  Keep all follow-up visits as told by your health care provider and therapist. This is important. Alcohol Use  Do not drink alcohol if: ? Your health care provider tells you not to drink. ? You are pregnant, may be pregnant, or are planning to become pregnant.  If you drink alcohol, limit how much you have: ? 0-1 drink a day for women. ? 0-2 drinks a day for men.  Be aware of how much alcohol is in your drink. In the [place] one drink equals one typical bottle of beer (12 oz), one-half glass of wine (5 oz), or one shot of hard liquor (1 oz). Contact a health care provider if you have:  Constant pain.  Weight loss.  Difficulty or pain when swallowing.  Diarrhea that gets worse. Get help right away if you have:  Severe abdominal pain.  Fever.  Diarrhea with symptoms of dehydration, such as dizziness or dry mouth.  Bright red blood in your stool.  Stool that is black and tarry.  Abdominal swelling.  Vomiting that does not stop.  Blood in your vomit. Summary  Irritable bowel syndrome (IBS) is not one specific disease. It is a group of symptoms that affects digestion.  Your intestines may become more sensitive and overreact to certain things. This may be especially true when you eat certain foods or when you are under stress.  There is no cure for IBS, but treatment can help relieve symptoms. This information is not intended to replace advice given to you by your health care provider. Make sure you discuss any questions you have with your health care provider. Document Revised: 03/09/2017 Document Reviewed: 03/09/2017 Elsevier Patient Education  2020 [place]. Diet for Irritable Bowel Syndrome When you have irritable bowel syndrome (IBS), it is very  important to eat the foods and follow the eating habits that are best for your condition. IBS may cause various symptoms such as pain in the abdomen, constipation, or diarrhea. Choosing the right foods can help to ease the discomfort from these symptoms. Work with your health care provider and diet and nutrition specialist (dietitian) to find the eating plan that will help to control your symptoms. What are tips for following this plan?      Keep a food diary. This will help you identify foods that cause symptoms. Write down: ? What you eat and when you eat it. ? What symptoms you have. ? When symptoms occur in relation to your meals, such as "pain in abdomen 2 hours after dinner."  Eat your meals slowly and in a relaxed setting.  Aim to eat 5-6 small meals per day. Do not skip meals.  Drink enough fluid to keep your urine pale yellow.  Ask your health care provider if you should take an over-the-counter probiotic to help restore healthy bacteria in your gut (digestive tract). ? Probiotics are foods that contain good bacteria and yeasts.  Your dietitian may have specific dietary  recommendations for you based on your symptoms. He or she may recommend that you: ? Avoid foods that cause symptoms. Talk with your dietitian about other ways to get the same nutrients that are in those problem foods. ? Avoid foods with gluten. Gluten is a protein that is found in rye, wheat, and barley. ? Eat more foods that contain soluble fiber. Examples of foods with high soluble fiber include oats, seeds, and certain fruits and vegetables. Take a fiber supplement if directed by your dietitian. ? Reduce or avoid certain foods called FODMAPs. These are foods that contain carbohydrates that are hard to digest. Ask your doctor which foods contain these carbohydrates. What foods are not recommended? The following are some foods and drinks that may make your symptoms worse:  Fatty foods, such as french fries.   Foods that contain gluten, such as pasta and cereal.  Dairy products, such as milk, cheese, and ice cream.  Chocolate.  Alcohol.  Products with caffeine, such as coffee.  Carbonated drinks, such as soda.  Foods that are high in FODMAPs. These include certain fruits and vegetables.  Products with sweeteners such as honey, high fructose corn syrup, sorbitol, and mannitol. The items listed above may not be a complete list of foods and beverages you should avoid. Contact a dietitian for more information. What foods are good sources of fiber? Your health care provider or dietitian may recommend that you eat more foods that contain fiber. Fiber can help to reduce constipation and other IBS symptoms. Add foods with fiber to your diet a little at a time so your body can get used to them. Too much fiber at one time might cause gas and swelling of your abdomen. The following are some foods that are good sources of fiber:  Berries, such as raspberries, strawberries, and blueberries.  Tomatoes.  Carrots.  Brown rice.  Oats.  Seeds, such as chia and pumpkin seeds. The items listed above may not be a complete list of recommended sources of fiber. Contact your dietitian for more options. Where to find more information  International Foundation for Functional Gastrointestinal Disorders: www.iffgd.[employer]: [employer] Summary  When you have irritable bowel syndrome (IBS), it is very important to eat the foods and follow the eating habits that are best for your condition.  IBS may cause various symptoms such as pain in the abdomen, constipation, or diarrhea.  Choosing the right foods can help to ease the discomfort that comes from symptoms.  Keep a food diary. This will help you identify foods that cause symptoms.  Your health care provider or diet and nutrition specialist (dietitian) may recommend that you eat more foods  that contain fiber. This information is not intended to replace advice given to you by your health care provider. Make sure you discuss any questions you have with your health care provider. Document Revised: 07/06/2018 Document Reviewed: 11/17/2016 Elsevier Patient Education  2020 [place].  Dupuytren's Contracture Dupuytren's contracture is a condition in which tissue under the skin of the palm becomes thick. This causes one or more of the fingers to curl inward (contract) toward the palm. After a while, the fingers may not be able to straighten out. This condition affects some or all of the fingers and the palm of the hand. This condition may affect one or both hands. Dupuytren's contracture is a long-term (chronic) condition that develops (progresses) slowly over time. There is no cure, but symptoms can  be managed and progression can be slowed with treatment. This condition is usually not dangerous or painful, but it can interfere with everyday tasks. What are the causes?  This condition is caused by tissue (fascia) in the palm that gets thicker and tighter. When the fascia thickens, it pulls on the cords of tissue (tendons) that control finger movement. This causes the fingers to contract. The cause of fascia thickening is not known. However, the condition is often passed along from parent to child (inherited). What increases the risk? The following factors may make you more likely to develop this condition:  Being 44 years of age or older.  Being male.  Having a family history of this condition.  Using tobacco products, including cigarettes, chewing tobacco, and e-cigarettes.  Drinking alcohol excessively.  Having diabetes.  Having a seizure disorder. What are the signs or symptoms? Early symptoms of this condition may include:  Thick, puckered skin on the hand.  One or more lumps (nodules) on the palm. Nodules may be tender when they first appear, but they are generally  painless. Later symptoms of this condition may include:  Thick cords of tissue in the palm.  Fingers curled up toward the palm.  Inability to straighten the fingers into their normal position. Though this condition is usually painless, you may have discomfort when holding or grabbing objects. How is this diagnosed? This condition is diagnosed with a physical exam, which may include:  Looking at your hands and feeling your palms. This is to check for thickened fascia and nodules.  Measuring finger motion.  Doing the [place] tabletop test. You may be asked to try to put your hand on a surface, with your palm down and your fingers straight out. How is this treated? There is no cure for this condition, but treatment can relieve discomfort and make symptoms more manageable. Treatment options may include:  Physical therapy. This can strengthen your hand and increase flexibility.  Occupational therapy. This can help you with everyday tasks that may be more difficult because of your condition.  Shots (injections). Substances may be injected into your hand, such as: ? Medicines that help to decrease swelling (corticosteroids). ? Proteins (collagenase) to weaken thick tissue. After a collagenase injection, your health care provider may stretch your fingers.  Needle aponeurotomy. A needle is pushed through the skin and into the fascia. Moving the needle against the fascia can weaken or break up the thick tissue.  Surgery. This may be needed if your condition causes discomfort or interferes with everyday activities. Physical therapy is usually needed after surgery. No treatment is guaranteed to cure this condition. Recurrence of symptoms is common. Follow these instructions at home: Hand care  Take these actions to help protect your hand from possible injury: ? Use tools that have padded grips. ? Wear protective gloves while you work with your hands. ? Avoid repetitive hand movements.  General instructions  Take over-the-counter and prescription medicines only as told by your health care provider.  Manage any other conditions that you have, such as diabetes.  If physical therapy was prescribed, do exercises as told by your health care provider.  Do not use any products that contain nicotine or tobacco, such as cigarettes, e-cigarettes, and chewing tobacco. If you need help quitting, ask your health care provider.  If you drink alcohol: ? Limit how much you use to:  0-1 drink a day for women.  0-2 drinks a day for men. ? Be aware of how much  alcohol is in your drink. In the [place] one drink equals one 12 oz bottle of beer (355 mL), one 5 oz glass of wine (148 mL), or one 1 oz glass of hard liquor (44 mL).  Keep all follow-up visits as told by your health care provider. This is important. Contact a health care provider if:  You develop new symptoms, or your symptoms get worse.  You have pain that gets worse or does not get better with medicine.  You have difficulty or discomfort with everyday tasks.  You develop numbness or tingling. Get help right away if:  You have severe pain.  Your fingers change color or become unusually cold. Summary  Dupuytren's contracture is a condition in which tissue under the skin of the palm becomes thick.  This condition is caused by tissue (fascia) that thickens. When it thickens, it pulls on the cords of tissue (tendons) that control finger movement and makes the fingers to contract.  You are more likely to develop this condition if you are a man, are over 24 years of age, have a family history of the condition, and drink a lot of alcohol.  This condition can be treated with physical and occupational therapy, injections, and surgery.  Follow instructions about how to care for your hand. Get help right away if you have severe pain or your fingers change color or become cold. This information is not intended to replace  advice given to you by your health care provider. Make sure you discuss any questions you have with your health care provider. Document Revised: 10/05/2017 Document Reviewed: 10/05/2017 Elsevier Patient Education  [place].

## 2019-08-02 NOTE — Progress Notes (Signed)
Date:  08/02/2019   Name:  Garrett Barnes   DOB:  [birth year]   MRN:  [id number]   Chief Complaint: Annual Exam and Irritable Bowel Syndrome (wants to ask about a diet and a medication for this)  Patient is a 54 year old male who presents for a comprehensive physical exam. The patient reports the following problems: ibs/foot. Health maintenance has been reviewed up to date.   Lab Results  Component Value Date   CREATININE 0.97 11/08/2017   BUN 12 11/08/2017   NA 136 11/08/2017   K 3.6 11/08/2017   CL 98 11/08/2017   CO2 23 11/08/2017   Lab Results  Component Value Date   CHOL 200 (H) 11/08/2017   HDL 45 11/08/2017   LDLCALC 130 (H) 11/08/2017   TRIG 127 11/08/2017   CHOLHDL 4.4 11/08/2017   No results found for: TSH No results found for: HGBA1C No results found for: WBC, HGB, HCT, MCV, PLT No results found for: ALT, AST, GGT, ALKPHOS, BILITOT   Review of Systems  Constitutional: Negative for chills and fever.  HENT: Negative for drooling, ear discharge, ear pain and sore throat.   Respiratory: Negative for cough, shortness of breath and wheezing.   Cardiovascular: Negative for chest pain, palpitations and leg swelling.  Gastrointestinal: Negative for abdominal pain, blood in stool, constipation, diarrhea and nausea.  Endocrine: Negative for polydipsia.  Genitourinary: Negative for dysuria, frequency, hematuria and urgency.  Musculoskeletal: Negative for back pain, myalgias and neck pain.  Skin: Negative for rash.  Allergic/Immunologic: Negative for environmental allergies.  Neurological: Negative for dizziness and headaches.  Hematological: Does not bruise/bleed easily.  Psychiatric/Behavioral: Negative for suicidal ideas. The patient is not nervous/anxious.     Patient Active Problem List   Diagnosis Date Noted  . Encounter for screening colonoscopy     Allergies  Allergen Reactions  . Latex Hives  . Penicillins Rash    Diarrhea    Past Surgical History:    Procedure Laterality Date  . COLONOSCOPY WITH PROPOFOL N/A 11/10/2017   Procedure: COLONOSCOPY WITH PROPOFOL;  Surgeon: Lin Landsman, MD;  Location: [place];  Service: Endoscopy;  Laterality: N/A;  . PILONIDAL CYST EXCISION    . WISDOM TOOTH EXTRACTION      Social History   Tobacco Use  . Smoking status: Never Smoker  . Smokeless tobacco: Never Used  Substance Use Topics  . Alcohol use: No    Alcohol/week: 0.0 standard drinks  . Drug use: No     Medication list has been reviewed and updated.  Current Meds  Medication Sig  . Multiple Vitamin (MULTIVITAMIN) capsule Take 1 capsule by mouth daily.  . tadalafil (CIALIS) 5 MG tablet Take 5 mg by mouth.  . triamcinolone (KENALOG) 0.1 % paste Use as directed in the mouth or throat daily.    PHQ 2/9 Scores 08/02/2019 11/08/2017 06/07/2017 09/11/2015  PHQ - 2 Score 0 0 0 1  PHQ- 9 Score 0 0 0 -    BP Readings from Last 3 Encounters:  08/02/19 120/80  11/10/17 105/78  11/08/17 120/88    Physical Exam Vitals and nursing note reviewed.  Constitutional:      Appearance: Normal appearance. He is well-developed and well-groomed.  HENT:     Head: Normocephalic.     Jaw: There is normal jaw occlusion.     Right Ear: Hearing, tympanic membrane, ear canal and external ear normal.     Left Ear: Hearing, tympanic membrane, ear  canal and external ear normal.     Nose: Nose normal.     Mouth/Throat:     Lips: Pink.     Mouth: Mucous membranes are pale, dry and cyanotic.  Eyes:     General: Lids are normal. Vision grossly intact. Gaze aligned appropriately. No scleral icterus.       Right eye: No discharge.        Left eye: No discharge.     Conjunctiva/sclera: Conjunctivae normal.     Pupils: Pupils are equal, round, and reactive to light.     Funduscopic exam:    Right eye: Red reflex present.        Left eye: Red reflex present. Neck:     Thyroid: No thyroid mass, thyromegaly or thyroid tenderness.      Vascular: Normal carotid pulses. No carotid bruit, hepatojugular reflux or JVD.     Trachea: Trachea and phonation normal. No tracheal deviation.  Cardiovascular:     Rate and Rhythm: Normal rate and regular rhythm.     Chest Wall: PMI is not displaced. No thrill.     Pulses: Normal pulses.          Carotid pulses are 2+ on the right side and 2+ on the left side.      Radial pulses are 2+ on the right side and 2+ on the left side.       Femoral pulses are 2+ on the right side and 2+ on the left side.      Popliteal pulses are 2+ on the right side and 2+ on the left side.       Dorsalis pedis pulses are 2+ on the right side and 2+ on the left side.       Posterior tibial pulses are 2+ on the right side and 2+ on the left side.     Heart sounds: Normal heart sounds, S1 normal and S2 normal. No murmur. No systolic murmur. No diastolic murmur. No friction rub. No gallop. No S3 or S4 sounds.   Pulmonary:     Effort: No respiratory distress.     Breath sounds: Normal breath sounds. No decreased breath sounds, wheezing, rhonchi or rales.  Chest:     Breasts: Breasts are symmetrical.        Right: Normal.        Left: Normal.  Abdominal:     General: Bowel sounds are normal.     Palpations: Abdomen is soft. There is no hepatomegaly, splenomegaly or mass.     Tenderness: There is no abdominal tenderness. There is no right CVA tenderness, left CVA tenderness, guarding or rebound.     Hernia: There is no hernia in the left inguinal area or right inguinal area.  Genitourinary:    Penis: Normal. No tenderness, discharge, swelling or lesions.      Testes: Normal.        Right: Mass, testicular hydrocele or varicocele not present.        Left: Mass, testicular hydrocele or varicocele not present.     Epididymis:     Right: Normal.     Left: Normal.  Musculoskeletal:        General: No tenderness. Normal range of motion.     Cervical back: Full passive range of motion without pain, normal range of  motion and neck supple.     Right lower leg: No edema.     Left lower leg: No edema.     Comments: Plantar  tendon swelling  Feet:     Comments: Plantar fibromatosis Lymphadenopathy:     Head:     Right side of head: No submandibular adenopathy.     Left side of head: No submandibular adenopathy.     Cervical: No cervical adenopathy.     Right cervical: No superficial or deep cervical adenopathy.    Left cervical: No superficial or deep cervical adenopathy.  Skin:    General: Skin is warm.     Capillary Refill: Capillary refill takes less than 2 seconds.     Findings: No rash.  Neurological:     Mental Status: He is alert and oriented to person, place, and time.     Cranial Nerves: Cranial nerves are intact. No cranial nerve deficit.     Sensory: Sensation is intact.     Motor: Motor function is intact.     Gait: Gait is intact.     Deep Tendon Reflexes: Reflexes are normal and symmetric.  Psychiatric:        Attention and Perception: Attention normal.        Mood and Affect: Mood and affect normal.        Speech: Speech normal.        Behavior: Behavior is cooperative.        Cognition and Memory: Cognition normal.     Wt Readings from Last 3 Encounters:  08/02/19 183 lb (83 kg)  11/10/17 173 lb (78.5 kg)  11/08/17 178 lb (80.7 kg)    BP 120/80   Pulse 84   Ht 5\' 8"  (1.727 m)   Wt 183 lb (83 kg)   BMI 27.83 kg/m   Assessment and Plan:  1. Annual physical exam No subjective/objective concerns noted on history and physical exam.  Patient's chart was reviewed and there was no concerns of previous visits, most recent imaging, most recent labs, and care everywhere.Caidin Taveras is a 54 y.o. male who presents today for his Complete Annual Exam. He feels well. He reports exercising . He reports he is sleeping well.Immunizations are reviewed and recommendations provided.   Age appropriate screening tests are discussed. Counseling given for risk factor reduction interventions.   We will obtain renal function panel to assess electrolytes and GFR. - Renal Function Panel  2. Mixed hyperlipidemia Reviewed the patient's had previous elevated triglycerides and LDL.  Will check lipid panel for evaluation. - Lipid Panel With LDL/HDL Ratio  3. Fatigue, unspecified type Patient's had some fatigability and we will check a TSH to assess thyroid concerns - TSH  4. Irritable bowel syndrome with both constipation and diarrhea Patient's been having abdominal discomfort generalized with alternating constipation diarrhea.  Information was given because he would like to try dietary approach first if this should continue our next step is to refer to Dr. Marius Ditch Who he is seen in the past.  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  This is consistent with Dupuytren like presentation but of the foot.  My astute partner pointed out that that he has a plantar fibromatosis and I concur.

## 2019-08-03 LAB — RENAL FUNCTION PANEL
Albumin: 4.7 g/dL (ref 3.8–4.9)
BUN/Creatinine Ratio: 9 (ref 9–20)
BUN: 8 mg/dL (ref 6–24)
CO2: 24 mmol/L (ref 20–29)
Calcium: 9.5 mg/dL (ref 8.7–10.2)
Chloride: 102 mmol/L (ref 96–106)
Creatinine, Ser: 0.93 mg/dL (ref 0.76–1.27)
GFR calc Af Amer: 109 mL/min/{1.73_m2} (ref >59)
GFR calc non Af Amer: 95 mL/min/{1.73_m2} (ref >59)
Glucose: 93 mg/dL (ref 65–99)
Phosphorus: 3 mg/dL (ref 2.8–4.1)
Potassium: 4.6 mmol/L (ref 3.5–5.2)
Sodium: 139 mmol/L (ref 134–144)

## 2019-08-03 LAB — TSH: TSH: 1.1 u[IU]/mL (ref 0.450–4.500)

## 2019-08-03 LAB — LIPID PANEL WITH LDL/HDL RATIO
Cholesterol, Total: 187 mg/dL (ref 100–199)
HDL: 48 mg/dL (ref >39)
LDL Chol Calc (NIH): 120 mg/dL — ABNORMAL HIGH (ref 0–99)
LDL/HDL Ratio: 2.5 ratio (ref 0.0–3.6)
Triglycerides: 102 mg/dL (ref 0–149)
VLDL Cholesterol Cal: 19 mg/dL (ref 5–40)

## 2019-11-02 ENCOUNTER — Telehealth: Payer: Self-pay | Admitting: Family Medicine

## 2019-11-02 NOTE — Telephone Encounter (Signed)
Copied from [place] [id number]. Topic: General - Other >> Nov 02, 2019 11:05 AM Celene Kras wrote: Reason for CRM: Pt called and is requesting to speak with Baxter Flattery. He states that it is something personal and would not go into detail. Please advise.

## 2019-11-03 NOTE — Telephone Encounter (Signed)
Pt called following up on yesterday's call. Wants call back as soon as possible. No details(confidential).

## 2019-11-06 NOTE — Telephone Encounter (Signed)
Spoke to pt concerning jury duty excuse- wanted for fear of covid. I explained he would have to attend despite covid, use precautions

## 2020-02-06 ENCOUNTER — Ambulatory Visit: Payer: BC Managed Care – PPO | Admitting: Family Medicine

## 2020-06-18 ENCOUNTER — Other Ambulatory Visit: Payer: Self-pay

## 2020-06-18 ENCOUNTER — Ambulatory Visit: Payer: BC Managed Care – PPO | Admitting: Family Medicine

## 2020-06-18 ENCOUNTER — Telehealth: Payer: Self-pay | Admitting: Gastroenterology

## 2020-06-18 ENCOUNTER — Encounter: Payer: Self-pay | Admitting: Family Medicine

## 2020-06-18 VITALS — BP 120/78 | HR 80 | Temp 100.0°F | Ht 68.0 in | Wt 175.0 lb

## 2020-06-18 DIAGNOSIS — N410 Acute prostatitis: Secondary | ICD-10-CM | POA: Diagnosis not present

## 2020-06-18 DIAGNOSIS — K121 Other forms of stomatitis: Secondary | ICD-10-CM

## 2020-06-18 DIAGNOSIS — H1131 Conjunctival hemorrhage, right eye: Secondary | ICD-10-CM | POA: Diagnosis not present

## 2020-06-18 DIAGNOSIS — Z1211 Encounter for screening for malignant neoplasm of colon: Secondary | ICD-10-CM | POA: Diagnosis not present

## 2020-06-18 LAB — POCT URINALYSIS DIPSTICK
Bilirubin, UA: NEGATIVE
Blood, UA: NEGATIVE
Glucose, UA: NEGATIVE
Ketones, UA: NEGATIVE
Leukocytes, UA: NEGATIVE
Nitrite, UA: NEGATIVE
Protein, UA: NEGATIVE
Spec Grav, UA: 1.015 (ref 1.010–1.025)
Urobilinogen, UA: 0.2 E.U./dL
pH, UA: 6 (ref 5.0–8.0)

## 2020-06-18 MED ORDER — SULFAMETHOXAZOLE-TRIMETHOPRIM 800-160 MG PO TABS: 1.0000 | ORAL_TABLET | Freq: Two times a day (BID) | ORAL | 0 refills | Status: DC

## 2020-06-18 MED ORDER — TRIAMCINOLONE ACETONIDE 0.1 % MT PSTE: PASTE | Freq: Every day | OROMUCOSAL | 0 refills | Status: DC

## 2020-06-18 NOTE — Telephone Encounter (Signed)
Patient called asking if his 07/16/20 NP appointment can be virtual?

## 2020-06-18 NOTE — Progress Notes (Signed)
Date:  06/18/2020   Name:  Garrett Barnes   DOB:  November 29, 1965   MRN:  [id number]   Chief Complaint: Groin Pain (Hurting with low grade fever. Has been having difficulty with bowels. Had to strain to have a BM- blew a vessel in his R) eye. Had a place on penis that appeared and now is gone.) and Mouth Lesions (Had a canker sore last week under tongue- once that cleared up, all this other stuff happened- refill triamcinolone paste)  Mouth Lesions  The current episode started more than 1 week ago. Progression since onset: wax and wanes. Associated symptoms include a fever and mouth sores. Pertinent negatives include no decreased vision, no double vision, no eye itching, no photophobia, no abdominal pain, no constipation, no diarrhea, no nausea, no congestion, no ear discharge, no ear pain, no headaches, no hearing loss, no rhinorrhea, no sore throat, no stridor, no swollen glands, no neck pain, no cough, no wheezing, no rash, no eye discharge, no eye pain and no eye redness.  Male GU Problem The patient's pertinent negatives include no genital injury, genital itching, genital lesions, pelvic pain, penile discharge, penile pain, priapism, scrotal swelling or testicular pain. This is a new problem. The current episode started more than 1 year ago. The problem occurs intermittently. The problem has been waxing and waning. The pain is mild. Associated symptoms include a fever, frequency, hesitancy and urgency. Pertinent negatives include no abdominal pain, chest pain, chills, constipation, coughing, diarrhea, dysuria, headaches, nausea, rash, shortness of breath or sore throat.    Lab Results  Component Value Date   CREATININE 0.93 08/02/2019   BUN 8 08/02/2019   NA 139 08/02/2019   K 4.6 08/02/2019   CL 102 08/02/2019   CO2 24 08/02/2019   Lab Results  Component Value Date   CHOL 187 08/02/2019   HDL 48 08/02/2019   LDLCALC 120 (H) 08/02/2019   TRIG 102 08/02/2019   CHOLHDL 4.4 11/08/2017    Lab Results  Component Value Date   TSH 1.100 08/02/2019   No results found for: HGBA1C No results found for: WBC, HGB, HCT, MCV, PLT No results found for: ALT, AST, GGT, ALKPHOS, BILITOT   Review of Systems  Constitutional: Positive for fever. Negative for chills.  HENT: Positive for mouth sores. Negative for congestion, drooling, ear discharge, ear pain, hearing loss, rhinorrhea and sore throat.   Eyes: Negative for double vision, photophobia, pain, discharge, redness and itching.  Respiratory: Negative for cough, shortness of breath, wheezing and stridor.   Cardiovascular: Negative for chest pain, palpitations and leg swelling.  Gastrointestinal: Negative for abdominal pain, blood in stool, constipation, diarrhea and nausea.  Endocrine: Negative for polydipsia.  Genitourinary: Positive for frequency, hesitancy and urgency. Negative for dysuria, hematuria, pelvic pain, penile discharge, penile pain, scrotal swelling and testicular pain.  Musculoskeletal: Negative for back pain, myalgias and neck pain.  Skin: Negative for rash.  Allergic/Immunologic: Negative for environmental allergies.  Neurological: Negative for dizziness and headaches.  Hematological: Does not bruise/bleed easily.  Psychiatric/Behavioral: Negative for suicidal ideas. The patient is not nervous/anxious.     Patient Active Problem List   Diagnosis Date Noted  . Encounter for screening colonoscopy     Allergies  Allergen Reactions  . Latex Hives  . Penicillins Rash    Diarrhea    Past Surgical History:  Procedure Laterality Date  . COLONOSCOPY WITH PROPOFOL N/A 11/10/2017   Procedure: COLONOSCOPY WITH PROPOFOL;  Surgeon: Lin Landsman, MD;  Location: [place];  Service: Endoscopy;  Laterality: N/A;  . PILONIDAL CYST EXCISION    . WISDOM TOOTH EXTRACTION      Social History   Tobacco Use  . Smoking status: Never Smoker  . Smokeless tobacco: Never Used  Substance Use Topics  .  Alcohol use: No    Alcohol/week: 0.0 standard drinks  . Drug use: No     Medication list has been reviewed and updated.  Current Meds  Medication Sig  . Multiple Vitamin (MULTIVITAMIN) capsule Take 1 capsule by mouth daily.  . tadalafil (CIALIS) 5 MG tablet Take 5 mg by mouth.  . triamcinolone (KENALOG) 0.1 % paste Use as directed in the mouth or throat daily.    PHQ 2/9 Scores 08/02/2019 11/08/2017 06/07/2017 09/11/2015  PHQ - 2 Score 0 0 0 1  PHQ- 9 Score 0 0 0 -    GAD 7 : Generalized Anxiety Score 08/02/2019  Nervous, Anxious, on Edge 0  Control/stop worrying 0  Worry too much - different things 0  Trouble relaxing 0  Restless 0  Easily annoyed or irritable 0  Afraid - awful might happen 0  Total GAD 7 Score 0    BP Readings from Last 3 Encounters:  06/18/20 120/78  08/02/19 120/80  11/10/17 105/78    Physical Exam Vitals and nursing note reviewed.  HENT:     Head: Normocephalic.     Right Ear: Ear canal and external ear normal. There is no impacted cerumen.     Left Ear: Ear canal and external ear normal. There is no impacted cerumen.     Nose: Nose normal. No congestion or rhinorrhea.  Eyes:     General: No scleral icterus.       Right eye: No discharge.        Left eye: No discharge.     Conjunctiva/sclera: Conjunctivae normal.     Pupils: Pupils are equal, round, and reactive to light.  Neck:     Thyroid: No thyromegaly.     Vascular: No JVD.     Trachea: No tracheal deviation.  Cardiovascular:     Rate and Rhythm: Normal rate and regular rhythm.     Heart sounds: Normal heart sounds. No murmur heard. No friction rub. No gallop.   Pulmonary:     Effort: No respiratory distress.     Breath sounds: Normal breath sounds. No wheezing, rhonchi or rales.  Abdominal:     General: Bowel sounds are normal.     Palpations: Abdomen is soft. There is no mass.     Tenderness: There is no abdominal tenderness. There is no guarding or rebound.  Genitourinary:     Penis: Normal.      Testes: Normal.        Right: Mass not present.        Left: Mass not present.     Epididymis:     Right: Normal.     Left: Normal.     Prostate: Enlarged and tender. No nodules present.     Rectum: Normal. Guaiac result negative. No mass.  Musculoskeletal:        General: No tenderness. Normal range of motion.     Cervical back: Normal range of motion and neck supple.  Lymphadenopathy:     Cervical: No cervical adenopathy.  Skin:    General: Skin is warm.     Findings: No rash.  Neurological:     Mental Status: He is alert and oriented  to person, place, and time.     Cranial Nerves: No cranial nerve deficit.     Deep Tendon Reflexes: Reflexes are normal and symmetric.     Wt Readings from Last 3 Encounters:  06/18/20 175 lb (79.4 kg)  08/02/19 183 lb (83 kg)  11/10/17 173 lb (78.5 kg)    BP 120/78   Pulse 80   Temp 100 F (37.8 C) (Oral)   Ht 5\' 8"  (1.727 m)   Wt 175 lb (79.4 kg)   BMI 26.61 kg/m   Assessment and Plan: 1. Acute prostatitis Acute.  Persistent.  Patient has a history of UTI/prostate concerns and has been followed by Dr.Cope from [facility] neurologic in the past.  Patient has symptoms (fever frequency urgency hesitancy) and exam (enlarged prostate tender) consistent with acute prostatitis.  We will treat with Septra DS 1 twice a day for 2 weeks.  And patient is in the process of rescheduling appointment with urology given history of prostate cancer of his concern. - POCT urinalysis dipstick - sulfamethoxazole-trimethoprim (BACTRIM DS) 800-160 MG tablet; Take 1 tablet by mouth 2 (two) times daily.  Dispense: 30 tablet; Refill: 0  2. Scleral hemorrhage of right eye Patient has a small scleral hemorrhage which may have been caused by excessive straining with bowel movement.  3. Colon cancer screening And has history of polyps noted on colonoscopy.  I think he is beyond this time of repeat of procedure.  We have put in for a referral  with GI for setting up of screening colonoscopy in the near future.

## 2020-06-18 NOTE — Telephone Encounter (Signed)
Informed patient he need to be seen in the office. Patient states he can not make that appointment. He wants to be seen in [place]. Moved appointment to 06/24/2020

## 2020-06-20 ENCOUNTER — Telehealth: Payer: Self-pay | Admitting: Family Medicine

## 2020-06-20 ENCOUNTER — Ambulatory Visit: Payer: Self-pay | Admitting: *Deleted

## 2020-06-20 NOTE — Telephone Encounter (Signed)
Pick up miralax and colace

## 2020-06-20 NOTE — Telephone Encounter (Signed)
Pt is calling to report that he is very constipated. And wanting to know if he can take a laxative will taking sulfamethoxazole-trimethoprim (BACTRIM DS) 800-160 MG tablet [[id number]] for a prostate infection? Please advise  Called patient and reviewed issues with constipation. C/o ongoing issue with constipation and straining to have BM. LBM today. Instructed patient to contact pharmacy for further questions of interactions with taking a laxative and Bactrim. Care advise given that patient can take a stool softener or laxative as needed while taking bactrim. Patient reports he will be seeing GI for this chronic issue. Denies severe constipation , no report of nausea , vomiting, abdominal discomfort or rectal bleeding. Care advise given . Patient verbalized understanding of care advise and to call back if symptoms worsen.   Reason for Disposition . MILD-MODERATE constipation  Answer Assessment - Initial Assessment Questions 1. STOOL PATTERN OR FREQUENCY: "How often do you pass bowel movements (BMs)?"  (Normal range: tid to q 3 days)  "When was your last BM?"       Every day  2. STRAINING: "Do you have to strain to have a BM?"      Yes  3. RECTAL PAIN: "Does your rectum hurt when the stool comes out?" If Yes, ask: "Do you have hemorrhoids? How bad is the pain?"  (Scale 1-10; or mild, moderate, severe)     No hemorroids 4. STOOL COMPOSITION: "Are the stools hard?"      hard 5. BLOOD ON STOOLS: "Has there been any blood on the toilet tissue or on the surface of the BM?" If Yes, ask: "When was the last time?"      na 6. CHRONIC CONSTIPATION: "Is this a new problem for you?"  If no, ask: How long have you had this problem?" (days, weeks, months)      No . Will be seeing GI for constipation issues 7. CHANGES IN DIET OR HYDRATION: "Have there been any recent changes in your diet?" "How much fluids are you drinking consuming on a daily basis?"  "How much have you had to drink today?"     Na  8.  MEDICATIONS: "Have you been taking any new medications?" "Are you taking any narcotic pain medications?" (e.g., Vicoden, Percocet, morphine, dilaudid)     Bactrim  9. LAXATIVES: "Have you been using any stool softeners, laxatives, or enemas?"  If yes, ask "What, how often, and when was the last time?" No. Never used any medication for constipation 10.ACTIVITY:  "How much walking do you do every day? on a daily basis?"  "Has your activity level decreased in the past week?"        na 11. CAUSE: "What do you think is causing the constipation?"        Unsure  12. CANCER: "What type of cancer do you have?"       na 13. CANCER - TREATMENT: "What cancer treatments have you received?" "When did you last receive them?" (e.g., recent surgery, radiation, or chemotherapy). If triager has access to the patient's medical record, triager should review treatments and administration dates.       na 14. CANCER - NEUTROPENIA RISK: "Have you received chemotherapy recently? If Yes, ask: "When was it and what was your last CBC and ANC (absolute neutrophil count)?" "Were you told that your white cell count was low?" If triager has access to the patient's medical record, triager should review most recent labs. An ANC less than 1,000 - 1,500 means that the neutrophils are low  and the immune system is weak.       na 15. OTHER SYMPTOMS: "Do you have any other symptoms?" (e.g., abdominal pain, bloating, fever, vomiting)       Prostate infection 16. MEDICAL HISTORY: "Do you have a history of hemorrhoids, rectal fissures, or rectal surgery or rectal abscess?"       na  Protocols used: CANCER - CONSTIPATION-A-AH

## 2020-07-02 ENCOUNTER — Telehealth: Payer: Self-pay | Admitting: Family Medicine

## 2020-07-02 NOTE — Telephone Encounter (Signed)
Spoke to pt gave info will call back Friday.

## 2020-07-02 NOTE — Telephone Encounter (Signed)
Relation to pt: self  Call back number:  [phone] Pharmacy:  [place], [place] Phone:  [phone]  Fax:  [phone]      Reason for call:  Patient requesting a ZPACK for the following symptoms, stuffy, runny nose, watery eyes starting out as alergies but discharged is yellow, no fever, possible sinus infection. Today is the last day patient will taking prostate antibiotics prescribed by PCP.  No sore throat, no head ache, no fever

## 2020-07-02 NOTE — Telephone Encounter (Signed)
Due to just coming off an antibiotic- Jones wants pt to try allergy meds first- flonase nasal spray, mucinex and Robitussin otc. If no better by Friday- she will see him. She thinks it is just allergies and back to back antibiotic use can lead to diarrhea/ C-Diff. Please call him

## 2020-07-16 ENCOUNTER — Ambulatory Visit: Payer: BC Managed Care – PPO | Admitting: Gastroenterology

## 2020-07-25 ENCOUNTER — Encounter: Payer: Self-pay | Admitting: Gastroenterology

## 2020-07-25 ENCOUNTER — Other Ambulatory Visit: Payer: Self-pay

## 2020-07-25 ENCOUNTER — Ambulatory Visit: Payer: BC Managed Care – PPO | Admitting: Gastroenterology

## 2020-07-25 VITALS — BP 130/70 | HR 82 | Temp 98.0°F | Ht 68.0 in | Wt 175.4 lb

## 2020-07-25 DIAGNOSIS — R195 Other fecal abnormalities: Secondary | ICD-10-CM | POA: Diagnosis not present

## 2020-07-25 DIAGNOSIS — K580 Irritable bowel syndrome with diarrhea: Secondary | ICD-10-CM | POA: Diagnosis not present

## 2020-07-25 NOTE — Progress Notes (Signed)
Garrett Darby, MD [address]  [place]  [place], [place] [address]  Main: [phone]  Fax: [phone]    Gastroenterology Consultation  Referring Provider:     Juline Patch, MD Primary Care Physician:  Juline Patch, MD Primary Gastroenterologist:  Dr. Cephas Barnes Reason for Consultation:     Loose stools        HPI:   Garrett Barnes is a 55 y.o. male referred by Dr. Juline Patch, MD  for consultation & management of fluids.  Patient reports several years history of loose stools, particularly postprandial, 3 bowel movements after breakfast and 1 small bowel movement each after lunch and dinner.  His main concern is incomplete emptying and constant urge to use the bathroom.  He does report explosive gas.  He denies any abdominal bloating.  He denies any weight loss, rectal bleeding.  He does not smoke or drink alcohol.  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  He does not smoke or drink alcohol  NSAIDs: None  Antiplts/Anticoagulants/Anti thrombotics: None  GI Procedures:  Colonoscopy for screening 11/10/2017 - The examined portion of the ileum was normal. - Two 6 mm polyps in the ascending colon, removed with a hot snare. Resected and retrieved. - One 5 mm polyp in the ascending colon, removed with a cold snare. Resected and retrieved. - The distal rectum and anal verge are normal on retroflexion view. - The examination was otherwise normal.   Past Medical History:  Diagnosis Date  . PONV (postoperative nausea and vomiting)     Past Surgical History:  Procedure Laterality Date  . COLONOSCOPY WITH PROPOFOL N/A 11/10/2017   Procedure: COLONOSCOPY WITH PROPOFOL;  Surgeon: Lin Landsman, MD;  Location: [facility];  Service: Endoscopy;  Laterality: N/A;  . PILONIDAL CYST EXCISION    . WISDOM TOOTH EXTRACTION      Current Outpatient  Medications:  Marland Kitchen  Multiple Vitamin (MULTIVITAMIN) capsule, Take 1 capsule by mouth daily., Disp: , Rfl:  .  sulfamethoxazole-trimethoprim (BACTRIM DS) 800-160 MG tablet, Take 1 tablet by mouth 2 (two) times daily., Disp: 30 tablet, Rfl: 0 .  tadalafil (CIALIS) 5 MG tablet, Take 5 mg by mouth., Disp: , Rfl:  .  triamcinolone (KENALOG) 0.1 % paste, Use as directed in the mouth or throat daily., Disp: 5 g, Rfl: 0   History reviewed. No pertinent family history.   Social History   Tobacco Use  . Smoking status: Never Smoker  . Smokeless tobacco: Never Used  Substance Use Topics  . Alcohol use: No    Alcohol/week: 0.0 standard drinks  . Drug use: No    Allergies as of 07/25/2020 - Review Complete 07/25/2020  Allergen Reaction Noted  . Latex Hives 11/04/2017  . Penicillins Rash 09/11/2015    Review of Systems:    All systems reviewed and negative except where noted in HPI.   Physical Exam:  BP 130/70 (BP Location: Left Arm, Patient Position: Sitting, Cuff Size: Normal)   Pulse 82   Temp 98 F (36.7 C) (Oral)   Ht 5\' 8"  (1.727 m)   Wt 175 lb 6 oz (79.5 kg)   BMI 26.67 kg/m  No LMP for male patient.  General:   Alert,  Well-developed, well-nourished, pleasant and cooperative in NAD Head:  Normocephalic and atraumatic. Eyes:  Sclera clear, no icterus.  Conjunctiva pink. Ears:  Normal auditory acuity. Nose:  No deformity, discharge, or lesions. Mouth:  No deformity or lesions,oropharynx pink & moist. Neck:  Supple; no masses or thyromegaly. Lungs:  Respirations even and unlabored.  Clear throughout to auscultation.   No wheezes, crackles, or rhonchi. No acute distress. Heart:  Regular rate and rhythm; no murmurs, clicks, rubs, or gallops. Abdomen:  Normal bowel sounds. Soft, non-tender and mildly distended, tympanic without masses, hepatosplenomegaly or hernias noted.  No guarding or rebound tenderness.   Rectal: Not performed Msk:  Symmetrical without gross deformities.  Good, equal movement & strength bilaterally. Pulses:  Normal pulses noted. Extremities:  No clubbing or edema.  No cyanosis. Neurologic:  Alert and oriented x3;  grossly normal neurologically. Skin:  Intact without significant lesions or rashes. No jaundice. Psych:  Alert and cooperative. Normal mood and affect.  Imaging Studies: None  Assessment and Plan:   Garrett Barnes is a 55 y.o. 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 pylori stool antigen, GI profile PCR Discussed about trial of Bentyl, patient would like to defer at this time He is also due for surveillance colonoscopy in 10/2020.  Recommend colon biopsies and TI evaluation at that time   Follow up virtual visit 3 months   Garrett Darby, MD

## 2020-08-05 ENCOUNTER — Ambulatory Visit
Admission: RE | Admit: 2020-08-05 | Discharge: 2020-08-05 | Disposition: A | Discharge status: Home / Self Care | Payer: No Typology Code available for payment source | Attending: Family Medicine | Admitting: Family Medicine

## 2020-08-05 ENCOUNTER — Ambulatory Visit
Admission: RE | Admit: 2020-08-05 | Discharge: 2020-08-05 | Disposition: A | Discharge status: Home / Self Care | Payer: No Typology Code available for payment source | Source: Ambulatory Visit | Attending: Family Medicine | Admitting: Family Medicine

## 2020-08-05 ENCOUNTER — Other Ambulatory Visit: Payer: Self-pay

## 2020-08-05 ENCOUNTER — Ambulatory Visit: Payer: BC Managed Care – PPO | Admitting: Family Medicine

## 2020-08-05 ENCOUNTER — Encounter: Payer: Self-pay | Admitting: Family Medicine

## 2020-08-05 VITALS — BP 120/70 | HR 72 | Ht 68.0 in | Wt 177.0 lb

## 2020-08-05 DIAGNOSIS — S8002XD Contusion of left knee, subsequent encounter: Secondary | ICD-10-CM

## 2020-08-05 DIAGNOSIS — S0181XD Laceration without foreign body of other part of head, subsequent encounter: Secondary | ICD-10-CM

## 2020-08-05 DIAGNOSIS — Z4802 Encounter for removal of sutures: Secondary | ICD-10-CM

## 2020-08-05 DIAGNOSIS — S7012XS Contusion of left thigh, sequela: Secondary | ICD-10-CM | POA: Diagnosis not present

## 2020-08-05 NOTE — Progress Notes (Signed)
Date:  08/05/2020   Name:  Garrett Barnes   DOB:  12-21-1965   MRN:  [id number]   Chief Complaint: Follow-up (Fall- hit chin on corner of table- stitches need to be removed. ) and Knee Pain (L) knee pain since fall)  Knee Pain  The incident occurred more than 1 week ago. The injury mechanism was a fall. The pain is present in the left knee. The quality of the pain is described as aching. The pain is at a severity of 3/10. The pain is mild. Pertinent negatives include no inability to bear weight, loss of motion, loss of sensation, muscle weakness or numbness. He reports no foreign bodies present. Treatments tried: asa/to prevent blood clot. The treatment provided mild relief.    Lab Results  Component Value Date   CREATININE 0.93 08/02/2019   BUN 8 08/02/2019   NA 139 08/02/2019   K 4.6 08/02/2019   CL 102 08/02/2019   CO2 24 08/02/2019   Lab Results  Component Value Date   CHOL 187 08/02/2019   HDL 48 08/02/2019   LDLCALC 120 (H) 08/02/2019   TRIG 102 08/02/2019   CHOLHDL 4.4 11/08/2017   Lab Results  Component Value Date   TSH 1.100 08/02/2019   No results found for: HGBA1C No results found for: WBC, HGB, HCT, MCV, PLT No results found for: ALT, AST, GGT, ALKPHOS, BILITOT   Review of Systems  Constitutional: Negative for chills and fever.  HENT: Negative for drooling, ear discharge, ear pain and sore throat.   Respiratory: Negative for cough, shortness of breath and wheezing.   Cardiovascular: Negative for chest pain, palpitations and leg swelling.  Gastrointestinal: Negative for abdominal pain, blood in stool, constipation, diarrhea and nausea.  Endocrine: Negative for polydipsia.  Genitourinary: Negative for dysuria, frequency, hematuria and urgency.  Musculoskeletal: Negative for back pain, myalgias and neck pain.  Skin: Negative for rash.  Allergic/Immunologic: Negative for environmental allergies.  Neurological: Negative for dizziness, numbness and headaches.   Hematological: Does not bruise/bleed easily.  Psychiatric/Behavioral: Negative for suicidal ideas. The patient is not nervous/anxious.     Patient Active Problem List   Diagnosis Date Noted  . Other male erectile dysfunction 06/23/2019  . Incomplete emptying of bladder 02/03/2017  . Family history of prostate cancer 02/02/2017  . Benign localized hyperplasia of prostate with urinary obstruction 09/18/2013  . Chronic prostatitis 09/18/2013  . Increased frequency of urination 09/18/2013  . Other specified disorder of male genital organs(608.89) 09/18/2013    Allergies  Allergen Reactions  . Latex Hives  . Penicillins Rash    Diarrhea    Past Surgical History:  Procedure Laterality Date  . COLONOSCOPY WITH PROPOFOL N/A 11/10/2017   Procedure: COLONOSCOPY WITH PROPOFOL;  Surgeon: Lin Landsman, MD;  Location: [facility];  Service: Endoscopy;  Laterality: N/A;  . PILONIDAL CYST EXCISION    . WISDOM TOOTH EXTRACTION      Social History   Tobacco Use  . Smoking status: Never Smoker  . Smokeless tobacco: Never Used  Substance Use Topics  . Alcohol use: No    Alcohol/week: 0.0 standard drinks  . Drug use: No     Medication list has been reviewed and updated.  Current Meds  Medication Sig  . Multiple Vitamin (MULTIVITAMIN) capsule Take 1 capsule by mouth daily.  . tadalafil (CIALIS) 5 MG tablet Take 5 mg by mouth.  . triamcinolone (KENALOG) 0.1 % paste Use as directed in the mouth or throat daily.  PHQ 2/9 Scores 08/05/2020 08/02/2019 11/08/2017 06/07/2017  PHQ - 2 Score 0 0 0 0  PHQ- 9 Score 0 0 0 0    GAD 7 : Generalized Anxiety Score 08/05/2020 08/02/2019  Nervous, Anxious, on Edge 0 0  Control/stop worrying 0 0  Worry too much - different things 0 0  Trouble relaxing 0 0  Restless 0 0  Easily annoyed or irritable 0 0  Afraid - awful might happen 0 0  Total GAD 7 Score 0 0    BP Readings from Last 3 Encounters:  08/05/20 120/70  07/25/20 130/70   06/18/20 120/78    Physical Exam Vitals reviewed.  Skin:    Comments: 2 cm sub chin      Wt Readings from Last 3 Encounters:  08/05/20 177 lb (80.3 kg)  07/25/20 175 lb 6 oz (79.5 kg)  06/18/20 175 lb (79.4 kg)    BP 120/70   Pulse 72   Ht 5\' 8"  (1.727 m)   Wt 177 lb (80.3 kg)   BMI 26.91 kg/m   Assessment and Plan: 1. Chin laceration, subsequent encounter Patient sustained a 2 cm laceration underneath his chin and.  Area was cleaned with Betadine suture was separated with 11 blade on scalpel.  Running suture was removed and anchor knots were removed from sides.  Area was cleaned with saline.  Triple antibiotic ointment was applied and clean dressing was applied with placement with mask.  2. Visit for suture removal Is noted to  3. Contusion of left knee, subsequent encounter Patient has an ecchymosis of the lateral aspect of the left knee which I think he has blood that has gone along the edge of the iliotibial band to settle around the patella there is no significant tenderness but there is concern for possible fracture and we will obtain x-ray. Patient has decided not to get the xray after discussion concerning "what would be the next step if something were wrong?" I explained to him he would be sent to orthopedics. He opted out of xray. - DG Knee Complete 4 Views Left; Future  4. Contusion of left thigh, sequela There is a contusion with ecchymosis of the lateral aspect of the left thigh which is mildly tender and is gradually healing.

## 2020-09-11 LAB — GI PROFILE, STOOL, PCR

## 2020-09-11 LAB — H. PYLORI ANTIGEN, STOOL: H pylori Ag, Stl: NEGATIVE

## 2020-09-13 ENCOUNTER — Encounter: Payer: Self-pay | Admitting: Gastroenterology

## 2020-09-14 LAB — CELIAC DISEASE PANEL
Endomysial IgA: NEGATIVE
IgA/Immunoglobulin A, Serum: 181 mg/dL (ref 90–386)
Transglutaminase IgA: <2 U/mL (ref 0–3)

## 2020-09-16 ENCOUNTER — Encounter: Payer: Self-pay | Admitting: Gastroenterology

## 2020-10-23 ENCOUNTER — Telehealth (INDEPENDENT_AMBULATORY_CARE_PROVIDER_SITE_OTHER): Payer: Self-pay | Admitting: Gastroenterology

## 2020-10-23 DIAGNOSIS — Z800 Family history of malignant neoplasm of digestive organs: Secondary | ICD-10-CM

## 2020-10-23 DIAGNOSIS — Z86010 Personal history of colonic polyps: Secondary | ICD-10-CM

## 2020-10-23 MED ORDER — CLENPIQ 10-3.5-12 MG-GM -GM/160ML PO SOLN: 1.0000 | Freq: Once | ORAL | 0 refills | Status: AC

## 2020-10-23 NOTE — Progress Notes (Signed)
Gastroenterology Pre-Procedure Review  Request Date: 11/14/20 Requesting Physician: Dr. Marius Ditch  PATIENT REVIEW QUESTIONS: The patient responded to the following health history questions as indicated:    1. Are you having any GI issues?  IBS 2. Do you have a personal history of Polyps? yes (11/10/2017) 3. Do you have a family history of Colon Cancer or Polyps? yes (father & mother colon polyps; P. Grandmother colon cancer) 4. Diabetes Mellitus? no 5. Joint replacements in the past 12 months?no 6. Major health problems in the past 3 months?no 7. Any artificial heart valves, MVP, or defibrillator?no    MEDICATIONS & ALLERGIES:    Patient reports the following regarding taking any anticoagulation/antiplatelet therapy:   Plavix, Coumadin, Eliquis, Xarelto, Lovenox, Pradaxa, Brilinta, or Effient? no Aspirin? no  Patient confirms/reports the following medications:  Current Outpatient Medications  Medication Sig Dispense Refill   Multiple Vitamin (MULTIVITAMIN) capsule Take 1 capsule by mouth daily.     tadalafil (CIALIS) 5 MG tablet Take 5 mg by mouth.     triamcinolone (KENALOG) 0.1 % paste Use as directed in the mouth or throat daily. 5 g 0   No current facility-administered medications for this visit.    Patient confirms/reports the following allergies:  Allergies  Allergen Reactions   Latex Hives   Penicillins Rash    Diarrhea    No orders of the defined types were placed in this encounter.   AUTHORIZATION INFORMATION Primary Insurance: 1D#: Group #:  Secondary Insurance: 1D#: Group #:  SCHEDULE INFORMATION: Date: 11/14/20 Time: Location: [facility]

## 2020-11-04 ENCOUNTER — Ambulatory Visit (INDEPENDENT_AMBULATORY_CARE_PROVIDER_SITE_OTHER): Payer: BC Managed Care – PPO | Admitting: Family Medicine

## 2020-11-04 ENCOUNTER — Encounter: Payer: Self-pay | Admitting: Family Medicine

## 2020-11-04 ENCOUNTER — Other Ambulatory Visit: Payer: Self-pay

## 2020-11-04 VITALS — BP 120/80 | HR 84 | Ht 68.0 in | Wt 185.0 lb

## 2020-11-04 DIAGNOSIS — R5383 Other fatigue: Secondary | ICD-10-CM | POA: Diagnosis not present

## 2020-11-04 DIAGNOSIS — E782 Mixed hyperlipidemia: Secondary | ICD-10-CM

## 2020-11-04 DIAGNOSIS — Z0000 Encounter for general adult medical examination without abnormal findings: Secondary | ICD-10-CM | POA: Diagnosis not present

## 2020-11-04 DIAGNOSIS — N138 Other obstructive and reflux uropathy: Secondary | ICD-10-CM | POA: Diagnosis not present

## 2020-11-04 DIAGNOSIS — N401 Enlarged prostate with lower urinary tract symptoms: Secondary | ICD-10-CM

## 2020-11-04 LAB — POCT URINALYSIS DIPSTICK
Bilirubin, UA: NEGATIVE
Blood, UA: NEGATIVE
Glucose, UA: NEGATIVE
Ketones, UA: NEGATIVE
Leukocytes, UA: NEGATIVE
Nitrite, UA: NEGATIVE
Protein, UA: NEGATIVE
Spec Grav, UA: 1.01 (ref 1.010–1.025)
Urobilinogen, UA: 0.2 E.U./dL
pH, UA: 5 (ref 5.0–8.0)

## 2020-11-04 LAB — HEMOCCULT GUIAC POC 1CARD (OFFICE): Fecal Occult Blood, POC: NEGATIVE

## 2020-11-04 NOTE — Progress Notes (Signed)
Date:  11/04/2020   Name:  Garrett Barnes   DOB:  Oct 21, 1965   MRN:  [id number]   Chief Complaint: Annual Exam  Patient is a 55 year old male who presents for a comprehensive physical exam. The patient reports the following problems: none. Health maintenance has been reviewed up to date.     Lab Results  Component Value Date   CREATININE 0.93 08/02/2019   BUN 8 08/02/2019   NA 139 08/02/2019   K 4.6 08/02/2019   CL 102 08/02/2019   CO2 24 08/02/2019   Lab Results  Component Value Date   CHOL 187 08/02/2019   HDL 48 08/02/2019   LDLCALC 120 (H) 08/02/2019   TRIG 102 08/02/2019   CHOLHDL 4.4 11/08/2017   Lab Results  Component Value Date   TSH 1.100 08/02/2019   No results found for: HGBA1C No results found for: WBC, HGB, HCT, MCV, PLT No results found for: ALT, AST, GGT, ALKPHOS, BILITOT   Review of Systems  Constitutional:  Negative for chills and fever.  HENT:  Negative for drooling, ear discharge, ear pain and sore throat.   Respiratory:  Negative for cough, shortness of breath and wheezing.   Cardiovascular:  Negative for chest pain, palpitations and leg swelling.  Gastrointestinal:  Negative for abdominal pain, blood in stool, constipation, diarrhea and nausea.  Endocrine: Negative for polydipsia.  Genitourinary:  Positive for frequency. Negative for dysuria, hematuria and urgency.  Musculoskeletal:  Negative for back pain, myalgias and neck pain.  Skin:  Negative for rash.  Allergic/Immunologic: Negative for environmental allergies.  Neurological:  Negative for dizziness and headaches.  Hematological:  Does not bruise/bleed easily.  Psychiatric/Behavioral:  Negative for suicidal ideas. The patient is not nervous/anxious.    Patient Active Problem List   Diagnosis Date Noted   Other male erectile dysfunction 06/23/2019   Incomplete emptying of bladder 02/03/2017   Family history of prostate cancer 02/02/2017   Benign localized hyperplasia of prostate with  urinary obstruction 09/18/2013   Chronic prostatitis 09/18/2013   Increased frequency of urination 09/18/2013   Other specified disorder of male genital organs(608.89) 09/18/2013    Allergies  Allergen Reactions   Latex Hives   Penicillins Rash    Diarrhea    Past Surgical History:  Procedure Laterality Date   COLONOSCOPY WITH PROPOFOL N/A 11/10/2017   Procedure: COLONOSCOPY WITH PROPOFOL;  Surgeon: Lin Landsman, MD;  Location: [facility];  Service: Endoscopy;  Laterality: N/A;   PILONIDAL CYST EXCISION     WISDOM TOOTH EXTRACTION      Social History   Tobacco Use   Smoking status: Never   Smokeless tobacco: Never  Substance Use Topics   Alcohol use: No    Alcohol/week: 0.0 standard drinks   Drug use: No     Medication list has been reviewed and updated.  Current Meds  Medication Sig   Multiple Vitamin (MULTIVITAMIN) capsule Take 1 capsule by mouth daily.   tadalafil (CIALIS) 5 MG tablet Take 5 mg by mouth.   triamcinolone (KENALOG) 0.1 % paste Use as directed in the mouth or throat daily.    PHQ 2/9 Scores 11/04/2020 08/05/2020 08/02/2019 11/08/2017  PHQ - 2 Score 0 0 0 0  PHQ- 9 Score 0 0 0 0    GAD 7 : Generalized Anxiety Score 11/04/2020 08/05/2020 08/02/2019  Nervous, Anxious, on Edge 0 0 0  Control/stop worrying 0 0 0  Worry too much - different things 0 0  0  Trouble relaxing 0 0 0  Restless 0 0 0  Easily annoyed or irritable 0 0 0  Afraid - awful might happen 0 0 0  Total GAD 7 Score 0 0 0    BP Readings from Last 3 Encounters:  11/04/20 120/80  08/05/20 120/70  07/25/20 130/70    Physical Exam Vitals and nursing note reviewed.  Constitutional:      Appearance: Normal appearance. He is overweight.  HENT:     Head: Normocephalic.     Jaw: There is normal jaw occlusion.     Right Ear: Hearing, tympanic membrane, ear canal and external ear normal. There is no impacted cerumen.     Left Ear: Hearing, tympanic membrane, ear canal and  external ear normal. There is no impacted cerumen.     Nose: Nose normal. No congestion or rhinorrhea.     Mouth/Throat:     Pharynx: No oropharyngeal exudate or posterior oropharyngeal erythema.  Eyes:     General: Lids are normal. Vision grossly intact. Gaze aligned appropriately. No scleral icterus.       Right eye: No discharge.        Left eye: No discharge.     Extraocular Movements: Extraocular movements intact.     Conjunctiva/sclera: Conjunctivae normal.     Pupils: Pupils are equal, round, and reactive to light.     Funduscopic exam:    Right eye: Red reflex present.        Left eye: Red reflex present. Neck:     Thyroid: No thyroid mass, thyromegaly or thyroid tenderness.     Vascular: No JVD.     Trachea: No tracheal deviation.  Cardiovascular:     Rate and Rhythm: Normal rate and regular rhythm.     Chest Wall: PMI is not displaced.     Pulses: Normal pulses.          Carotid pulses are 2+ on the right side and 2+ on the left side.      Radial pulses are 2+ on the right side and 2+ on the left side.       Femoral pulses are 2+ on the right side and 2+ on the left side.      Popliteal pulses are 2+ on the right side and 2+ on the left side.       Dorsalis pedis pulses are 2+ on the right side and 2+ on the left side.       Posterior tibial pulses are 2+ on the right side and 2+ on the left side.     Heart sounds: Normal heart sounds, S1 normal and S2 normal. No murmur heard. No systolic murmur is present.  No diastolic murmur is present.    No friction rub. No gallop. No S3 or S4 sounds.  Pulmonary:     Effort: No respiratory distress.     Breath sounds: Normal breath sounds. No stridor. No decreased breath sounds, wheezing, rhonchi or rales.  Chest:     Chest wall: No mass or tenderness.  Breasts:    Right: Normal. No swelling, mass, axillary adenopathy or supraclavicular adenopathy.     Left: Normal. No swelling, mass, axillary adenopathy or supraclavicular  adenopathy.  Abdominal:     General: Bowel sounds are normal.     Palpations: Abdomen is soft. There is no hepatomegaly, splenomegaly or mass.     Tenderness: There is no abdominal tenderness. There is no right CVA tenderness, left CVA tenderness, guarding  or rebound.     Hernia: No hernia is present. There is no hernia in the umbilical area or ventral area.  Genitourinary:    Penis: Normal.      Testes: Normal.        Right: Mass not present.        Left: Mass not present.     Epididymis:     Right: Normal.     Left: Normal.  Musculoskeletal:        General: No tenderness. Normal range of motion.     Cervical back: Normal, full passive range of motion without pain, normal range of motion and neck supple.     Thoracic back: Normal.     Lumbar back: Normal.     Right lower leg: No edema.     Left lower leg: No edema.  Lymphadenopathy:     Head:     Right side of head: No submental, submandibular or tonsillar adenopathy.     Left side of head: No submental, submandibular or tonsillar adenopathy.     Cervical: No cervical adenopathy.     Right cervical: No superficial, deep or posterior cervical adenopathy.    Left cervical: No superficial, deep or posterior cervical adenopathy.     Upper Body:     Right upper body: No supraclavicular or axillary adenopathy.     Left upper body: No supraclavicular or axillary adenopathy.     Lower Body: No right inguinal adenopathy. No left inguinal adenopathy.  Skin:    General: Skin is warm.     Capillary Refill: Capillary refill takes less than 2 seconds.     Findings: No rash.     Comments: Laceration wound healed well  Neurological:     Mental Status: He is alert and oriented to person, place, and time.     Cranial Nerves: Cranial nerves are intact. No cranial nerve deficit.     Sensory: Sensation is intact.     Motor: Motor function is intact.     Deep Tendon Reflexes: Reflexes are normal and symmetric.     Reflex Scores:      Tricep  reflexes are 2+ on the right side and 2+ on the left side.      Bicep reflexes are 2+ on the right side and 2+ on the left side.      Brachioradialis reflexes are 2+ on the right side and 2+ on the left side.      Patellar reflexes are 2+ on the right side and 2+ on the left side.      Achilles reflexes are 2+ on the right side and 2+ on the left side. Psychiatric:        Behavior: Behavior is cooperative.    Wt Readings from Last 3 Encounters:  11/04/20 185 lb (83.9 kg)  08/05/20 177 lb (80.3 kg)  07/25/20 175 lb 6 oz (79.5 kg)    BP 120/80   Pulse 84   Ht '5\' 8"'$  (1.727 m)   Wt 185 lb (83.9 kg)   BMI 28.13 kg/m   Assessment and Plan:  1. Annual physical exam Dali Simonian is a 55 y.o. male who presents today for his Complete Annual Exam. He feels well. He reports exercising sparingly. He reports he is sleeping well.  Patient's chart was reviewed for previous encounters most recent labs most recent imaging in [place].  Labs were obtained including renal function panel point-of-care urinalysis and point-of-care occult stool blood. - POCT urinalysis dipstick -  Renal Function Panel - POCT Occult Blood Stool  2. Mixed hyperlipidemia Chronic.  Episodic.  Relatively stable.  Will check lipid panel for current level of involvement.  We will initiate Mediterranean diet because of weight concerns - Lipid Panel With LDL/HDL Ratio - TSH - CBC with Differential/Platelet  3. Benign localized hyperplasia of prostate with urinary obstruction Chronic.  Stable.  DRE is normal.  Will check PSA for current status of involvement. - POCT urinalysis dipstick - PSA  4. Fatigue, unspecified type Episodic.Katha Cabal and wanes.  Will check renal function panel along with TSH and CBC. - Renal Function Panel

## 2020-11-04 NOTE — Patient Instructions (Signed)

## 2020-11-05 LAB — TSH: TSH: 1.07 u[IU]/mL (ref 0.450–4.500)

## 2020-11-05 LAB — CBC WITH DIFFERENTIAL/PLATELET
Basophils Absolute: 0 10*3/uL (ref 0.0–0.2)
Basos: 0 %
EOS (ABSOLUTE): 0.2 10*3/uL (ref 0.0–0.4)
Eos: 4 %
Hematocrit: 48.3 % (ref 37.5–51.0)
Hemoglobin: 16.7 g/dL (ref 13.0–17.7)
Immature Grans (Abs): 0 10*3/uL (ref 0.0–0.1)
Immature Granulocytes: 0 %
Lymphocytes Absolute: 1.3 10*3/uL (ref 0.7–3.1)
Lymphs: 19 %
MCH: 29.5 pg (ref 26.6–33.0)
MCHC: 34.6 g/dL (ref 31.5–35.7)
MCV: 85 fL (ref 79–97)
Monocytes Absolute: 0.7 10*3/uL (ref 0.1–0.9)
Monocytes: 10 %
Neutrophils Absolute: 4.7 10*3/uL (ref 1.4–7.0)
Neutrophils: 67 %
Platelets: 172 10*3/uL (ref 150–450)
RBC: 5.67 x10E6/uL (ref 4.14–5.80)
RDW: 12.6 % (ref 11.6–15.4)
WBC: 6.9 10*3/uL (ref 3.4–10.8)

## 2020-11-05 LAB — LIPID PANEL WITH LDL/HDL RATIO
Cholesterol, Total: 196 mg/dL (ref 100–199)
HDL: 51 mg/dL (ref >39)
LDL Chol Calc (NIH): 112 mg/dL — ABNORMAL HIGH (ref 0–99)
LDL/HDL Ratio: 2.2 ratio (ref 0.0–3.6)
Triglycerides: 189 mg/dL — ABNORMAL HIGH (ref 0–149)
VLDL Cholesterol Cal: 33 mg/dL (ref 5–40)

## 2020-11-05 LAB — RENAL FUNCTION PANEL
Albumin: 4.7 g/dL (ref 3.8–4.9)
BUN/Creatinine Ratio: 14 (ref 9–20)
BUN: 14 mg/dL (ref 6–24)
CO2: 24 mmol/L (ref 20–29)
Calcium: 10 mg/dL (ref 8.7–10.2)
Chloride: 99 mmol/L (ref 96–106)
Creatinine, Ser: 1.03 mg/dL (ref 0.76–1.27)
Glucose: 89 mg/dL (ref 65–99)
Phosphorus: 3.4 mg/dL (ref 2.8–4.1)
Potassium: 4.3 mmol/L (ref 3.5–5.2)
Sodium: 144 mmol/L (ref 134–144)
eGFR: 87 mL/min/{1.73_m2} (ref >59)

## 2020-11-05 LAB — PSA: Prostate Specific Ag, Serum: 1 ng/mL (ref 0.0–4.0)

## 2020-11-07 ENCOUNTER — Encounter: Payer: Self-pay | Admitting: Gastroenterology

## 2020-11-14 ENCOUNTER — Ambulatory Visit: Payer: BC Managed Care – PPO | Admitting: Anesthesiology

## 2020-11-14 ENCOUNTER — Encounter: Payer: Self-pay | Admitting: Gastroenterology

## 2020-11-14 ENCOUNTER — Encounter
Admission: RE | Disposition: A | Discharge status: Home / Self Care | Payer: Self-pay | Source: Home / Self Care | Attending: Gastroenterology

## 2020-11-14 ENCOUNTER — Other Ambulatory Visit: Payer: Self-pay

## 2020-11-14 ENCOUNTER — Ambulatory Visit
Admission: RE | Admit: 2020-11-14 | Discharge: 2020-11-14 | Disposition: A | Discharge status: Home / Self Care | Payer: BC Managed Care – PPO | Attending: Gastroenterology | Admitting: Gastroenterology

## 2020-11-14 DIAGNOSIS — Z880 Allergy status to penicillin: Secondary | ICD-10-CM | POA: Diagnosis not present

## 2020-11-14 DIAGNOSIS — R197 Diarrhea, unspecified: Secondary | ICD-10-CM

## 2020-11-14 DIAGNOSIS — Z91040 Latex allergy status: Secondary | ICD-10-CM | POA: Diagnosis not present

## 2020-11-14 DIAGNOSIS — Z09 Encounter for follow-up examination after completed treatment for conditions other than malignant neoplasm: Secondary | ICD-10-CM | POA: Diagnosis present

## 2020-11-14 DIAGNOSIS — Z860100 Personal history of colon polyps, unspecified: Secondary | ICD-10-CM

## 2020-11-14 HISTORY — PX: COLONOSCOPY WITH PROPOFOL: SHX5780

## 2020-11-14 LAB — HM COLONOSCOPY

## 2020-11-14 SURGERY — COLONOSCOPY WITH PROPOFOL
Anesthesia: General | Site: Rectum

## 2020-11-14 MED ORDER — SODIUM CHLORIDE 0.9 % IV SOLN: INTRAVENOUS | Status: DC

## 2020-11-14 MED ORDER — OXYCODONE HCL 5 MG PO TABS: 5.0000 mg | ORAL_TABLET | Freq: Once | ORAL | Status: DC | PRN

## 2020-11-14 MED ORDER — PROPOFOL 10 MG/ML IV BOLUS
INTRAVENOUS | Status: DC | PRN
  Administered 2020-11-14 (×5): 20 mg via INTRAVENOUS
  Administered 2020-11-14: 30 mg via INTRAVENOUS
  Administered 2020-11-14: 20 mg via INTRAVENOUS
  Administered 2020-11-14: 90 mg via INTRAVENOUS
  Administered 2020-11-14: 20 mg via INTRAVENOUS

## 2020-11-14 MED ORDER — OXYCODONE HCL 5 MG/5ML PO SOLN: 5.0000 mg | Freq: Once | ORAL | Status: DC | PRN

## 2020-11-14 MED ORDER — MEPERIDINE HCL 25 MG/ML IJ SOLN: 6.2500 mg | INTRAMUSCULAR | Status: DC | PRN

## 2020-11-14 MED ORDER — STERILE WATER FOR IRRIGATION IR SOLN
Status: DC | PRN
  Administered 2020-11-14: .05 mL

## 2020-11-14 MED ORDER — LACTATED RINGERS IV SOLN: INTRAVENOUS | Status: DC

## 2020-11-14 MED ORDER — LIDOCAINE HCL (CARDIAC) PF 100 MG/5ML IV SOSY
PREFILLED_SYRINGE | INTRAVENOUS | Status: DC | PRN
  Administered 2020-11-14: 50 mg via INTRAVENOUS

## 2020-11-14 MED ORDER — FENTANYL CITRATE PF 50 MCG/ML IJ SOSY: 25.0000 ug | PREFILLED_SYRINGE | INTRAMUSCULAR | Status: DC | PRN

## 2020-11-14 MED ORDER — PROMETHAZINE HCL 25 MG/ML IJ SOLN: 6.2500 mg | INTRAMUSCULAR | Status: DC | PRN

## 2020-11-14 SURGICAL SUPPLY — 7 items
FORCEPS BIOP RAD 4 LRG CAP 4 (CUTTING FORCEPS) ×2 IMPLANT
GOWN CVR UNV OPN BCK APRN NK (MISCELLANEOUS) ×2 IMPLANT
GOWN ISOL THUMB LOOP REG UNIV (MISCELLANEOUS) ×4
KIT PRC NS LF DISP ENDO (KITS) ×1 IMPLANT
KIT PROCEDURE OLYMPUS (KITS) ×2
MANIFOLD NEPTUNE II (INSTRUMENTS) ×2 IMPLANT
WATER STERILE IRR 250ML POUR (IV SOLUTION) ×2 IMPLANT

## 2020-11-14 NOTE — Anesthesia Procedure Notes (Signed)
Date/Time: 11/14/2020 10:13 AM Performed by: Mayme Genta, CRNA Pre-anesthesia Checklist: Patient identified, Emergency Drugs available, Suction available, Timeout performed and Patient being monitored Patient Re-evaluated:Patient Re-evaluated prior to induction Oxygen Delivery Method: Nasal cannula Placement Confirmation: positive ETCO2

## 2020-11-14 NOTE — Op Note (Signed)
[facility] Gastroenterology Patient Name: Garrett Barnes Procedure Date: 11/14/2020 10:07 AM MRN: [id number] Account #: [id number] Date of Birth: 1965-10-05 Admit Type: Outpatient Age: 55 Room: [facility] OR ROOM 01 Gender: Male Note Status: Finalized Procedure:             Colonoscopy Indications:           Surveillance: Personal history of adenomatous polyps                         on last colonoscopy > 3 years ago, Last colonoscopy:                         August 2019 Providers:             Lin Landsman MD, MD Referring MD:          Juline Patch, MD (Referring MD) Medicines:             General Anesthesia Complications:         No immediate complications. Estimated blood loss: None. Procedure:             Pre-Anesthesia Assessment:                        - Prior to the procedure, a History and Physical was                         performed, and patient medications and allergies were                         reviewed. The patient is competent. The risks and                         benefits of the procedure and the sedation options and                         risks were discussed with the patient. All questions                         were answered and informed consent was obtained.                         Patient identification and proposed procedure were                         verified by the physician, the nurse, the                         anesthesiologist, the anesthetist and the technician                         in the pre-procedure area in the procedure room in the                         endoscopy suite. Mental Status Examination: alert and                         oriented. Airway Examination: normal oropharyngeal  airway and neck mobility. Respiratory Examination:                         clear to auscultation. CV Examination: normal.                         Prophylactic Antibiotics: The patient does not require                          prophylactic antibiotics. Prior Anticoagulants: The                         patient has taken no previous anticoagulant or                         antiplatelet agents. ASA Grade Assessment: II - A                         patient with mild systemic disease. After reviewing                         the risks and benefits, the patient was deemed in                         satisfactory condition to undergo the procedure. The                         anesthesia plan was to use general anesthesia.                         Immediately prior to administration of medications,                         the patient was re-assessed for adequacy to receive                         sedatives. The heart rate, respiratory rate, oxygen                         saturations, blood pressure, adequacy of pulmonary                         ventilation, and response to care were monitored                         throughout the procedure. The physical status of the                         patient was re-assessed after the procedure.                        After obtaining informed consent, the colonoscope was                         passed under direct vision. Throughout the procedure,                         the patient's blood pressure, pulse, and oxygen  saturations were monitored continuously. The                         Colonoscope was introduced through the anus and                         advanced to the the terminal ileum, with                         identification of the appendiceal orifice and IC                         valve. The colonoscopy was performed without                         difficulty. The patient tolerated the procedure well.                         The quality of the bowel preparation was evaluated                         using the BBPS [facility] Bowel Preparation Scale) with                         scores of: Right Colon = 3, Transverse Colon = 3 and                          Left Colon = 3 (entire mucosa seen well with no                         residual staining, small fragments of stool or opaque                         liquid). The total BBPS score equals 9. Findings:      The perianal and digital rectal examinations were normal. Pertinent       negatives include normal sphincter tone and no palpable rectal lesions.      The terminal ileum appeared normal.      Normal mucosa was found in the entire colon. Biopsies for histology were       taken with a cold forceps from the entire colon for evaluation of       microscopic colitis.      The retroflexed view of the distal rectum and anal verge was normal and       showed no anal or rectal abnormalities. Impression:            - The examined portion of the ileum was normal.                        - Normal mucosa in the entire examined colon. Biopsied.                        - The distal rectum and anal verge are normal on                         retroflexion view. Recommendation:        - Discharge patient to home (with spouse).                        -  Resume previous diet today.                        - Continue present medications.                        - Await pathology results.                        - Repeat colonoscopy in 7 years for screening purposes. Procedure Code(s):     --- Professional ---                        [phone], Colonoscopy, flexible; with biopsy, single or                         multiple Diagnosis Code(s):     --- Professional ---                        Z86.010, Personal history of colonic polyps CPT copyright 2019 American Medical Association. All rights reserved. The codes documented in this report are preliminary and upon coder review may  be revised to meet current compliance requirements. Dr. Ulyess Mort Lin Landsman MD, MD 11/14/2020 10:29:16 AM This report has been signed electronically. Number of Addenda: 0 Note Initiated On: 11/14/2020 10:07 AM Scope Withdrawal  Time: 0 hours 9 minutes 23 seconds  Total Procedure Duration: 0 hours 10 minutes 46 seconds  Estimated Blood Loss:  Estimated blood loss: none.      [employer]

## 2020-11-14 NOTE — Anesthesia Postprocedure Evaluation (Signed)
Anesthesia Post Note  Patient: Garrett Barnes  Procedure(s) Performed: COLONOSCOPY WITH PROPOFOL (Rectum)     Patient location during evaluation: PACU Anesthesia Type: General Level of consciousness: awake and alert Pain management: pain level controlled Vital Signs Assessment: post-procedure vital signs reviewed and stable Respiratory status: spontaneous breathing, nonlabored ventilation, respiratory function stable and patient connected to nasal cannula oxygen Cardiovascular status: blood pressure returned to baseline and stable Postop Assessment: no apparent nausea or vomiting Anesthetic complications: no   No notable events documented.  Krosby Ritchie, [place]

## 2020-11-14 NOTE — Anesthesia Preprocedure Evaluation (Signed)
Anesthesia Evaluation  Patient identified by MRN, date of birth, ID band Patient awake    Reviewed: Allergy & Precautions, NPO status , Patient's Chart, lab work & pertinent test results, reviewed documented beta blocker date and time   History of Anesthesia Complications (+) PONV and history of anesthetic complications (PONV POD 1 from dental procedure, likely narcotic related)  Airway Mallampati: II  TM Distance: >3 FB Neck ROM: Full    Dental no notable dental hx.    Pulmonary neg pulmonary ROS,    Pulmonary exam normal breath sounds clear to auscultation       Cardiovascular negative cardio ROS Normal cardiovascular exam Rhythm:Regular Rate:Normal     Neuro/Psych negative neurological ROS  negative psych ROS   GI/Hepatic negative GI ROS, Neg liver ROS,   Endo/Other  negative endocrine ROS  Renal/GU negative Renal ROS  negative genitourinary   Musculoskeletal negative musculoskeletal ROS (+)   Abdominal Normal abdominal exam  (+)   Peds  Hematology negative hematology ROS (+)   Anesthesia Other Findings   Reproductive/Obstetrics                             Anesthesia Physical  Anesthesia Plan  ASA: 2  Anesthesia Plan: General   Post-op Pain Management:    Induction: Intravenous  PONV Risk Score and Plan: 3 and Propofol infusion and Treatment may vary due to age or medical condition  Airway Management Planned: Natural Airway  Additional Equipment: None  Intra-op Plan:   Post-operative Plan:   Informed Consent: I have reviewed the patients History and Physical, chart, labs and discussed the procedure including the risks, benefits and alternatives for the proposed anesthesia with the patient or authorized representative who has indicated his/her understanding and acceptance.       Plan Discussed with: Anesthesiologist, CRNA and Surgeon  Anesthesia Plan Comments:          Anesthesia Quick Evaluation

## 2020-11-14 NOTE — H&P (Signed)
  Garrett Darby, MD [address]  [place]  [place], [place] [address]  Main: [phone]  Fax: [phone] Pager: [phone]  Primary Care Physician:  Juline Patch, MD Primary Gastroenterologist:  Dr. Cephas Barnes  Pre-Procedure History & Physical: HPI:  Garrett Barnes is a 55 y.o. male is here for an colonoscopy.   Past Medical History:  Diagnosis Date   PONV (postoperative nausea and vomiting)     Past Surgical History:  Procedure Laterality Date   COLONOSCOPY WITH PROPOFOL N/A 11/10/2017   Procedure: COLONOSCOPY WITH PROPOFOL;  Surgeon: Lin Landsman, MD;  Location: [facility];  Service: Endoscopy;  Laterality: N/A;   PILONIDAL CYST EXCISION     WISDOM TOOTH EXTRACTION      Prior to Admission medications   Medication Sig Start Date End Date Taking? Authorizing Provider  Multiple Vitamin (MULTIVITAMIN) capsule Take 1 capsule by mouth daily.   Yes [provider]  tadalafil (CIALIS) 5 MG tablet Take 5 mg by mouth. 09/17/15  Yes [provider]  triamcinolone (KENALOG) 0.1 % paste Use as directed in the mouth or throat daily. 06/18/20  Yes Juline Patch, MD    Allergies as of 10/23/2020 - Review Complete 10/23/2020  Allergen Reaction Noted   Latex Hives 11/04/2017   Penicillins Rash 09/11/2015    History reviewed. No pertinent family history.  Social History   Socioeconomic History   Marital status: Married    Spouse name: Not on file   Number of children: Not on file   Years of education: Not on file   Highest education level: Not on file  Occupational History   Not on file  Tobacco Use   Smoking status: Never   Smokeless tobacco: Never  Substance and Sexual Activity   Alcohol use: No    Alcohol/week: 0.0 standard drinks   Drug use: No   Sexual activity: Not on file  Other Topics Concern   Not on file  Social History Narrative   Not on file   Social Determinants of Health   Financial Resource Strain: Not  on file  Food Insecurity: Not on file  Transportation Needs: Not on file  Physical Activity: Not on file  Stress: Not on file  Social Connections: Not on file  Intimate Partner Violence: Not on file    Review of Systems: See HPI, otherwise negative ROS  Physical Exam: BP (!) 108/94   Pulse (!) 105   Temp 98.1 F (36.7 C) (Temporal)   Resp 16   Ht '5\' 8"'$  (1.727 m)   Wt 82.6 kg   SpO2 99%   BMI 27.67 kg/m  General:   Alert,  pleasant and cooperative in NAD Head:  Normocephalic and atraumatic. Neck:  Supple; no masses or thyromegaly. Lungs:  Clear throughout to auscultation.    Heart:  Regular rate and rhythm. Abdomen:  Soft, nontender and nondistended. Normal bowel sounds, without guarding, and without rebound.   Neurologic:  Alert and  oriented x4;  grossly normal neurologically.  Impression/Plan: Garrett Barnes is here for an colonoscopy to be performed for h/o colon polyps  Risks, benefits, limitations, and alternatives regarding  colonoscopy have been reviewed with the patient.  Questions have been answered.  All parties agreeable.   Sherri Sear, MD  11/14/2020, 9:31 AM

## 2020-11-14 NOTE — Transfer of Care (Signed)
Immediate Anesthesia Transfer of Care Note  Patient: Garrett Barnes  Procedure(s) Performed: COLONOSCOPY WITH PROPOFOL (Rectum)  Patient Location: PACU  Anesthesia Type: General  Level of Consciousness: awake, alert  and patient cooperative  Airway and Oxygen Therapy: Patient Spontanous Breathing and Patient connected to supplemental oxygen  Post-op Assessment: Post-op Vital signs reviewed, Patient's Cardiovascular Status Stable, Respiratory Function Stable, Patent Airway and No signs of Nausea or vomiting  Post-op Vital Signs: Reviewed and stable  Complications: No notable events documented.

## 2020-11-15 ENCOUNTER — Encounter: Payer: Self-pay | Admitting: Gastroenterology

## 2020-11-15 LAB — SURGICAL PATHOLOGY

## 2020-11-28 ENCOUNTER — Telehealth: Payer: BC Managed Care – PPO | Admitting: Gastroenterology

## 2020-11-28 ENCOUNTER — Encounter: Payer: Self-pay | Admitting: Gastroenterology

## 2020-11-28 ENCOUNTER — Ambulatory Visit (INDEPENDENT_AMBULATORY_CARE_PROVIDER_SITE_OTHER): Payer: BC Managed Care – PPO | Admitting: Gastroenterology

## 2020-11-28 DIAGNOSIS — K580 Irritable bowel syndrome with diarrhea: Secondary | ICD-10-CM

## 2020-11-28 NOTE — Progress Notes (Signed)
Sherri Sear, MD [address]  [place], [place] [address]  Main: [phone]  Fax: [phone]    Gastroenterology Consultation Tele Visit  Referring Provider:     Juline Patch, MD Primary Care Physician:  Juline Patch, MD Primary Gastroenterologist:  Dr. Cephas Darby Reason for Consultation:     IBS-diarrhea        HPI:   Garrett Barnes is a 55 y.o. male referred by Dr. Juline Patch, MD  for consultation & management of IBS-diarrhea  Virtual Visit via Telephone Note  I connected with Garrett Barnes on 11/28/20 at  2:30 PM EDT by telephone and verified that I am speaking with the correct person using two identifiers.   I discussed the limitations, risks, security and privacy concerns of performing an evaluation and management service by telephone and the availability of in person appointments. I also discussed with the patient that there may be a patient responsible charge related to this service. The patient expressed understanding and agreed to proceed.  Location of the Patient: Work  Location of the provider: Work  Persons participating in the visit: Patient and provider only  History of Present Illness:  Garrett Barnes is a 55 year old pleasant Caucasian male with history of chronic loose stools.  He continues to have ongoing loose stools predominantly postprandial.  He underwent extensive work-up as below H. pylori stool antigen negative, celiac panel negative, GI profile PCR negative Colonoscopy with TI evaluation and biopsies are negative, TSH normal  NSAIDs: None  Antiplts/Anticoagulants/Anti thrombotics: None  GI Procedures: Colonoscopy 11/14/2020 - The examined portion of the ileum was normal. - Normal mucosa in the entire examined colon. Biopsied. - The distal rectum and anal verge are normal on retroflexion view.  DIAGNOSIS:  A. COLON, RANDOM; BIOPSY:  - BENIGN COLONIC MUCOSA WITH FOCAL SUPERFICIAL HYPERPLASTIC CHANGES.  - NEGATIVE  FOR ACTIVE INFLAMMATION AND FEATURES OF CHRONICITY.  - NEGATIVE FOR MICROSCOPIC COLITIS, DYSPLASIA, AND MALIGNANCY.   Past Medical History:  Diagnosis Date   PONV (postoperative nausea and vomiting)     Past Surgical History:  Procedure Laterality Date   COLONOSCOPY WITH PROPOFOL N/A 11/10/2017   Procedure: COLONOSCOPY WITH PROPOFOL;  Surgeon: Lin Landsman, MD;  Location: [facility];  Service: Endoscopy;  Laterality: N/A;   COLONOSCOPY WITH PROPOFOL N/A 11/14/2020   Procedure: COLONOSCOPY WITH PROPOFOL;  Surgeon: Lin Landsman, MD;  Location: [facility];  Service: Endoscopy;  Laterality: N/A;   PILONIDAL CYST EXCISION     WISDOM TOOTH EXTRACTION      Current Outpatient Medications:    Multiple Vitamin (MULTIVITAMIN) capsule, Take 1 capsule by mouth daily., Disp: , Rfl:    tadalafil (CIALIS) 5 MG tablet, Take 5 mg by mouth., Disp: , Rfl:    triamcinolone (KENALOG) 0.1 % paste, Use as directed in the mouth or throat daily., Disp: 5 g, Rfl: 0    History reviewed. No pertinent family history.   Social History   Tobacco Use   Smoking status: Never   Smokeless tobacco: Never  Substance Use Topics   Alcohol use: No    Alcohol/week: 0.0 standard drinks   Drug use: No    Allergies as of 11/28/2020 - Review Complete 11/28/2020  Allergen Reaction Noted   Latex Hives 11/04/2017   Penicillins Rash 09/11/2015     Imaging Studies: No abdominal imaging  Assessment and Plan:   Garrett Barnes is a 55 y.o. pleasant male with no significant  past medical history had a televisit for follow-up of chronic loose stools.  After extensive work-up, patient's symptoms are most likely thought to be secondary to diarrhea predominant IBS.  Today, I have discussed various management options including medical therapy such as trial of low-dose TCAs, empiric trial of antibiotics for bacterial overgrowth, symptom management with antidiarrheal and antispasmodic medication,  dietary management with low FODMAPs diet.  Also, wanted to try prebiotics.  He is not keen on taking any medication at this time.  He said he will try low FODMAPs diet.  Patient will update me via MyChart about his progress in next 1 to 2 months   Follow Up Instructions:   I discussed the assessment and treatment plan with the patient. The patient was provided an opportunity to ask questions and all were answered. The patient agreed with the plan and demonstrated an understanding of the instructions.   The patient was advised to call back or seek an in-person evaluation if the symptoms worsen or if the condition fails to improve as anticipated.  I provided 20 minutes of non-face-to-face time during this encounter.   Follow up as needed   Cephas Darby, MD

## 2020-11-28 NOTE — Telephone Encounter (Signed)
Made appointment to discuss path

## 2020-11-28 NOTE — Telephone Encounter (Signed)
Patient called and cancel mychart visit and he will call back to reschedule visit at a later time

## 2020-12-18 ENCOUNTER — Encounter: Payer: Self-pay | Admitting: Gastroenterology

## 2021-04-01 ENCOUNTER — Ambulatory Visit: Payer: BC Managed Care – PPO | Admitting: Family Medicine

## 2021-04-01 ENCOUNTER — Other Ambulatory Visit: Payer: Self-pay

## 2021-04-01 ENCOUNTER — Encounter: Payer: Self-pay | Admitting: Family Medicine

## 2021-04-01 VITALS — BP 110/70 | HR 80 | Ht 68.0 in | Wt 188.0 lb

## 2021-04-01 DIAGNOSIS — B370 Candidal stomatitis: Secondary | ICD-10-CM | POA: Diagnosis not present

## 2021-04-01 DIAGNOSIS — J358 Other chronic diseases of tonsils and adenoids: Secondary | ICD-10-CM | POA: Diagnosis not present

## 2021-04-01 DIAGNOSIS — J4520 Mild intermittent asthma, uncomplicated: Secondary | ICD-10-CM

## 2021-04-01 DIAGNOSIS — J029 Acute pharyngitis, unspecified: Secondary | ICD-10-CM | POA: Diagnosis not present

## 2021-04-01 DIAGNOSIS — K130 Diseases of lips: Secondary | ICD-10-CM

## 2021-04-01 LAB — POCT RAPID STREP A (OFFICE): Rapid Strep A Screen: NEGATIVE

## 2021-04-01 MED ORDER — FLUCONAZOLE 150 MG PO TABS: 150.0000 mg | ORAL_TABLET | Freq: Once | ORAL | 0 refills | Status: AC

## 2021-04-01 MED ORDER — NYSTATIN 100000 UNIT/ML MT SUSP: 5.0000 mL | Freq: Four times a day (QID) | OROMUCOSAL | 0 refills | Status: DC

## 2021-04-01 MED ORDER — MONTELUKAST SODIUM 10 MG PO TABS: 10.0000 mg | ORAL_TABLET | Freq: Every day | ORAL | 3 refills | Status: DC

## 2021-04-01 MED ORDER — VALACYCLOVIR HCL 500 MG PO TABS: 500.0000 mg | ORAL_TABLET | Freq: Two times a day (BID) | ORAL | 0 refills | Status: DC

## 2021-04-01 NOTE — Progress Notes (Signed)
Date:  04/01/2021   Name:  Garrett Barnes   DOB:  Jan 20, 1966   MRN:  [id number]   Chief Complaint: Sore Throat (Having pain when swallowing water, white "sores" on inside of lip, they do not hurt. Took wash cloth and wiped "white stuff" off lips- today lips are "inflamed and hurting")  Patient is a 56 year old male who presents for a new onset oral lesions. The patient reports the following problems: white film. Health maintenance has been reviewed upto date.    GI Problem Primary symptoms do not include fever, weight loss, fatigue, abdominal pain, nausea, vomiting, diarrhea, melena, hematemesis, jaundice, hematochezia, dysuria, myalgias, arthralgias or rash. The illness began 3 to 5 days ago (4 days ago). The problem has been gradually improving.  The illness is also significant for dysphagia. The illness does not include chills, anorexia, odynophagia, bloating, constipation, tenesmus, back pain or itching. Associated medical issues do not include inflammatory bowel disease, liver disease, alcohol abuse, PUD, gastric bypass, bowel resection, irritable bowel syndrome, hemorrhoids or diverticulitis.  Wheezing  This is a recurrent problem. The current episode started more than 1 month ago. The problem occurs intermittently. The problem has been waxing and waning. Associated symptoms include shortness of breath. Pertinent negatives include no abdominal pain, chest pain, chills, coughing, diarrhea, ear pain, fever, headaches, neck pain, rash, sore throat or vomiting. He has tried beta agonist inhalers for the symptoms. The treatment provided mild relief. His past medical history is significant for asthma.   Lab Results  Component Value Date   NA 144 11/04/2020   K 4.3 11/04/2020   CO2 24 11/04/2020   GLUCOSE 89 11/04/2020   BUN 14 11/04/2020   CREATININE 1.03 11/04/2020   CALCIUM 10.0 11/04/2020   EGFR 87 11/04/2020   GFRNONAA 95 08/02/2019   Lab Results  Component Value Date   CHOL 196  11/04/2020   HDL 51 11/04/2020   LDLCALC 112 (H) 11/04/2020   TRIG 189 (H) 11/04/2020   CHOLHDL 4.4 11/08/2017   Lab Results  Component Value Date   TSH 1.070 11/04/2020   No results found for: HGBA1C Lab Results  Component Value Date   WBC 6.9 11/04/2020   HGB 16.7 11/04/2020   HCT 48.3 11/04/2020   MCV 85 11/04/2020   PLT 172 11/04/2020   No results found for: ALT, AST, GGT, ALKPHOS, BILITOT No results found for: 25OHVITD2, 25OHVITD3, VD25OH   Review of Systems  Constitutional:  Negative for chills, fatigue, fever and weight loss.  HENT:  Negative for drooling, ear discharge, ear pain and sore throat.   Respiratory:  Positive for shortness of breath and wheezing. Negative for cough and choking.   Cardiovascular:  Negative for chest pain, palpitations and leg swelling.  Gastrointestinal:  Positive for dysphagia. Negative for abdominal pain, anorexia, bloating, blood in stool, constipation, diarrhea, hematemesis, hematochezia, jaundice, melena, nausea and vomiting.       Discomfort swallowing fluids  Endocrine: Negative for polydipsia.  Genitourinary:  Negative for dysuria, frequency, hematuria and urgency.  Musculoskeletal:  Negative for arthralgias, back pain, myalgias and neck pain.  Skin:  Negative for itching and rash.  Allergic/Immunologic: Negative for environmental allergies.  Neurological:  Negative for dizziness and headaches.  Hematological:  Does not bruise/bleed easily.  Psychiatric/Behavioral:  Negative for suicidal ideas. The patient is not nervous/anxious.    Patient Active Problem List   Diagnosis Date Noted   History of colonic polyps    Diarrhea    Other  male erectile dysfunction 06/23/2019   Incomplete emptying of bladder 02/03/2017   Family history of prostate cancer 02/02/2017   Benign localized hyperplasia of prostate with urinary obstruction 09/18/2013   Chronic prostatitis 09/18/2013   Increased frequency of urination 09/18/2013   Other  specified disorder of male genital organs(608.89) 09/18/2013    Allergies  Allergen Reactions   Latex Hives   Penicillins Rash    Diarrhea    Past Surgical History:  Procedure Laterality Date   COLONOSCOPY WITH PROPOFOL N/A 11/10/2017   Procedure: COLONOSCOPY WITH PROPOFOL;  Surgeon: Lin Landsman, MD;  Location: [facility];  Service: Endoscopy;  Laterality: N/A;   COLONOSCOPY WITH PROPOFOL N/A 11/14/2020   Procedure: COLONOSCOPY WITH PROPOFOL;  Surgeon: Lin Landsman, MD;  Location: [facility];  Service: Endoscopy;  Laterality: N/A;   PILONIDAL CYST EXCISION     WISDOM TOOTH EXTRACTION      Social History   Tobacco Use   Smoking status: Never   Smokeless tobacco: Never  Substance Use Topics   Alcohol use: No    Alcohol/week: 0.0 standard drinks   Drug use: No     Medication list has been reviewed and updated.  Current Meds  Medication Sig   Multiple Vitamin (MULTIVITAMIN) capsule Take 1 capsule by mouth daily.   tadalafil (CIALIS) 5 MG tablet Take 5 mg by mouth.   triamcinolone (KENALOG) 0.1 % paste Use as directed in the mouth or throat daily.    PHQ 2/9 Scores 11/04/2020 08/05/2020 08/02/2019 11/08/2017  PHQ - 2 Score 0 0 0 0  PHQ- 9 Score 0 0 0 0    GAD 7 : Generalized Anxiety Score 11/04/2020 08/05/2020 08/02/2019  Nervous, Anxious, on Edge 0 0 0  Control/stop worrying 0 0 0  Worry too much - different things 0 0 0  Trouble relaxing 0 0 0  Restless 0 0 0  Easily annoyed or irritable 0 0 0  Afraid - awful might happen 0 0 0  Total GAD 7 Score 0 0 0    BP Readings from Last 3 Encounters:  04/01/21 110/70  11/14/20 108/81  11/04/20 120/80    Physical Exam Vitals and nursing note reviewed.  HENT:     Head: Normocephalic.     Right Ear: Tympanic membrane and external ear normal.     Left Ear: Tympanic membrane and external ear normal.     Nose: Nose normal.     Mouth/Throat:     Dentition: Normal dentition. No gum lesions.      Tongue: No lesions.     Palate: No lesions.     Pharynx: Pharyngeal swelling, oropharyngeal exudate and posterior oropharyngeal erythema present. No uvula swelling.     Tonsils: Tonsillar exudate present. No tonsillar abscesses.  Eyes:     General: No scleral icterus.       Right eye: No discharge.        Left eye: No discharge.     Conjunctiva/sclera: Conjunctivae normal.     Pupils: Pupils are equal, round, and reactive to light.  Neck:     Thyroid: No thyromegaly.     Vascular: No JVD.     Trachea: No tracheal deviation.  Cardiovascular:     Rate and Rhythm: Normal rate and regular rhythm.     Heart sounds: Normal heart sounds. No murmur heard.   No friction rub. No gallop.  Pulmonary:     Effort: No respiratory distress.     Breath  sounds: Normal breath sounds. No wheezing or rales.  Abdominal:     General: Bowel sounds are normal.     Palpations: Abdomen is soft. There is no mass.     Tenderness: There is no abdominal tenderness. There is no guarding or rebound.  Musculoskeletal:        General: No tenderness. Normal range of motion.     Cervical back: Normal range of motion and neck supple.  Lymphadenopathy:     Cervical: No cervical adenopathy.  Skin:    General: Skin is warm.     Findings: No rash.  Neurological:     Mental Status: He is alert and oriented to person, place, and time.     Cranial Nerves: No cranial nerve deficit.     Deep Tendon Reflexes: Reflexes are normal and symmetric.    Wt Readings from Last 3 Encounters:  04/01/21 188 lb (85.3 kg)  11/14/20 182 lb (82.6 kg)  11/04/20 185 lb (83.9 kg)    BP 110/70    Pulse 80    Ht _0  (1.727 m)    Wt 188 lb (85.3 kg)    BMI 28.59 kg/m   Assessment and Plan:  1. Pharyngitis, unspecified etiology New onset.  Persistent.  Ulceration or exudate noted on left tonsil.  Strep test was obtained and was negative. - POCT rapid strep A  2. Mild intermittent reactive airway disease without  complication Chronic.  Episodic.  Stable.  There is mild intermittent breakthrough.  We will resume Singulair 10 mg nightly. - montelukast (SINGULAIR) 10 MG tablet; Take 1 tablet (10 mg total) by mouth at bedtime.  Dispense: 30 tablet; Refill: 3  3. Tonsillar exudate Chronic.  Episodic.  Stable.  Continue Singulair 10 mg once a day - valACYclovir (VALTREX) 500 MG tablet; Take 1 tablet (500 mg total) by mouth 2 (two) times daily.  Dispense: 14 tablet; Refill: 0  4. Oral thrush Patient has erythema of the throat.  We will treat with Mycostatin oral suspension and Diflucan. - nystatin (MYCOSTATIN) 100000 UNIT/ML suspension; Take 5 mLs (500,000 Units total) by mouth 4 (four) times daily.  Dispense: 60 mL; Refill: 0 - fluconazole (DIFLUCAN) 150 MG tablet; Take 1 tablet (150 mg total) by mouth once for 1 dose.  Dispense: 1 tablet; Refill: 0  5. Cheilitis Cheilitis and chapped lips presumably from wearing of lips I have encouraged patient not to continue use Chapstick has small ventriculitis.  If continued we may need to use some antifungal.  In the meantime we will initiate valve acyclovir 500 mg for suspected viral ulceration. - valACYclovir (VALTREX) 500 MG tablet; Take 1 tablet (500 mg total) by mouth 2 (two) times daily.  Dispense: 14 tablet; Refill: 0

## 2021-11-05 ENCOUNTER — Ambulatory Visit: Payer: Self-pay | Admitting: *Deleted

## 2021-11-05 ENCOUNTER — Encounter: Payer: Self-pay | Admitting: Family Medicine

## 2021-11-05 ENCOUNTER — Ambulatory Visit (INDEPENDENT_AMBULATORY_CARE_PROVIDER_SITE_OTHER): Payer: BC Managed Care – PPO | Admitting: Family Medicine

## 2021-11-05 VITALS — BP 120/82 | HR 80 | Ht 68.0 in | Wt 185.0 lb

## 2021-11-05 DIAGNOSIS — J4520 Mild intermittent asthma, uncomplicated: Secondary | ICD-10-CM

## 2021-11-05 DIAGNOSIS — B370 Candidal stomatitis: Secondary | ICD-10-CM | POA: Diagnosis not present

## 2021-11-05 DIAGNOSIS — Z0000 Encounter for general adult medical examination without abnormal findings: Secondary | ICD-10-CM

## 2021-11-05 LAB — HEMOCCULT GUIAC POC 1CARD (OFFICE): Fecal Occult Blood, POC: NEGATIVE

## 2021-11-05 MED ORDER — NYSTATIN 100000 UNIT/ML MT SUSP: 5.0000 mL | Freq: Four times a day (QID) | OROMUCOSAL | 0 refills | Status: DC

## 2021-11-05 MED ORDER — MONTELUKAST SODIUM 10 MG PO TABS: 10.0000 mg | ORAL_TABLET | Freq: Every day | ORAL | 1 refills | Status: DC

## 2021-11-05 NOTE — Telephone Encounter (Signed)
Reason for Disposition  Caller has medicine question only, adult not sick, AND triager answers question    Nystatin mouth rinse  Answer Assessment - Initial Assessment Questions 1. NAME of MEDICINE: "What medicine(s) are you calling about?"     Nystatin rinse.    2. QUESTION: "What is your question?" (e.g., double dose of medicine, side effect)     Am I supposed to rinse and spit this out or swallow it? 3. PRESCRIBER: "Who prescribed the medicine?" Reason: if prescribed by specialist, call should be referred to that group.     Dr. Ronnald Ramp prescribed it.   I broke out in a rash in my mouth after using an inhaler. 4. SYMPTOMS: "Do you have any symptoms?" If Yes, ask: "What symptoms are you having?"  "How bad are the symptoms (e.g., mild, moderate, severe)     No symptoms from medication 5. PREGNANCY:  "Is there any chance that you are pregnant?" "When was your last menstrual period?"     N/A  Protocols used: Medication Question Call-A-AH

## 2021-11-05 NOTE — Progress Notes (Signed)
Date:  11/05/2021   Name:  Garrett Barnes   DOB:  07-12-1965   MRN:  [id number]   Chief Complaint: Annual Exam  Patient is a 56 year old male who presents for a comprehensive physical exam. The patient reports the following problems: none. Health maintenance has been reviewed up to date.      Lab Results  Component Value Date   NA 144 11/04/2020   K 4.3 11/04/2020   CO2 24 11/04/2020   GLUCOSE 89 11/04/2020   BUN 14 11/04/2020   CREATININE 1.03 11/04/2020   CALCIUM 10.0 11/04/2020   EGFR 87 11/04/2020   GFRNONAA 95 08/02/2019   Lab Results  Component Value Date   CHOL 196 11/04/2020   HDL 51 11/04/2020   LDLCALC 112 (H) 11/04/2020   TRIG 189 (H) 11/04/2020   CHOLHDL 4.4 11/08/2017   Lab Results  Component Value Date   TSH 1.070 11/04/2020   No results found for: "HGBA1C" Lab Results  Component Value Date   WBC 6.9 11/04/2020   HGB 16.7 11/04/2020   HCT 48.3 11/04/2020   MCV 85 11/04/2020   PLT 172 11/04/2020   No results found for: "ALT", "AST", "GGT", "ALKPHOS", "BILITOT" No results found for: "25OHVITD2", "25OHVITD3", "VD25OH"   Review of Systems  Constitutional:  Negative for chills and fever.  HENT:  Negative for drooling, ear discharge, ear pain and sore throat.   Respiratory:  Negative for cough, shortness of breath and wheezing.   Cardiovascular:  Negative for chest pain, palpitations and leg swelling.  Gastrointestinal:  Negative for abdominal pain, blood in stool, constipation, diarrhea and nausea.  Endocrine: Negative for polydipsia.  Genitourinary:  Negative for dysuria, frequency, hematuria and urgency.  Musculoskeletal:  Negative for back pain, myalgias and neck pain.  Skin:  Negative for rash.  Allergic/Immunologic: Negative for environmental allergies.  Neurological:  Negative for dizziness and headaches.  Hematological:  Does not bruise/bleed easily.  Psychiatric/Behavioral:  Negative for suicidal ideas. The patient is not nervous/anxious.      Patient Active Problem List   Diagnosis Date Noted   History of colonic polyps    Diarrhea    Other male erectile dysfunction 06/23/2019   Incomplete emptying of bladder 02/03/2017   Family history of prostate cancer 02/02/2017   Benign localized hyperplasia of prostate with urinary obstruction 09/18/2013   Chronic prostatitis 09/18/2013   Increased frequency of urination 09/18/2013   Other specified disorder of male genital organs(608.89) 09/18/2013    Allergies  Allergen Reactions   Latex Hives   Penicillins Rash    Diarrhea    Past Surgical History:  Procedure Laterality Date   COLONOSCOPY WITH PROPOFOL N/A 11/10/2017   Procedure: COLONOSCOPY WITH PROPOFOL;  Surgeon: Lin Landsman, MD;  Location: [facility];  Service: Endoscopy;  Laterality: N/A;   COLONOSCOPY WITH PROPOFOL N/A 11/14/2020   Procedure: COLONOSCOPY WITH PROPOFOL;  Surgeon: Lin Landsman, MD;  Location: [facility];  Service: Endoscopy;  Laterality: N/A;   PILONIDAL CYST EXCISION     WISDOM TOOTH EXTRACTION      Social History   Tobacco Use   Smoking status: Never   Smokeless tobacco: Never  Substance Use Topics   Alcohol use: No    Alcohol/week: 0.0 standard drinks of alcohol   Drug use: No     Medication list has been reviewed and updated.  Current Meds  Medication Sig   Multiple Vitamin (MULTIVITAMIN) capsule Take 1 capsule by mouth daily.  tadalafil (CIALIS) 5 MG tablet Take 5 mg by mouth.   triamcinolone (KENALOG) 0.1 % paste Use as directed in the mouth or throat daily.       11/05/2021    9:42 AM 11/04/2020    9:58 AM 08/05/2020    1:21 PM 08/02/2019    8:58 AM  GAD 7 : Generalized Anxiety Score  Nervous, Anxious, on Edge 2 0 0 0  Control/stop worrying 3 0 0 0  Worry too much - different things 3 0 0 0  Trouble relaxing 3 0 0 0  Restless 1 0 0 0  Easily annoyed or irritable 2 0 0 0  Afraid - awful might happen 2 0 0 0  Total GAD 7 Score 16 0 0 0   Anxiety Difficulty Somewhat difficult          11/05/2021    9:41 AM 11/04/2020    9:57 AM 08/05/2020    1:21 PM  Depression screen PHQ 2/9  Decreased Interest 1 0 0  Down, Depressed, Hopeless 1 0 0  PHQ - 2 Score 2 0 0  Altered sleeping 2 0 0  Tired, decreased energy 2 0 0  Change in appetite 0 0 0  Feeling bad or failure about yourself  2 0 0  Trouble concentrating 2 0 0  Moving slowly or fidgety/restless 1 0 0  Suicidal thoughts 0 0 0  PHQ-9 Score 11 0 0  Difficult doing work/chores Somewhat difficult      BP Readings from Last 3 Encounters:  11/05/21 120/82  04/01/21 110/70  11/14/20 108/81    Physical Exam Vitals and nursing note reviewed.  Constitutional:      Appearance: He is well-groomed and overweight.  HENT:     Head: Normocephalic.     Jaw: There is normal jaw occlusion.     Right Ear: Hearing, tympanic membrane, ear canal and external ear normal.     Left Ear: Hearing, tympanic membrane, ear canal and external ear normal.     Nose: Nose normal.     Mouth/Throat:     Lips: Pink.     Mouth: Mucous membranes are moist.     Dentition: Normal dentition. No dental tenderness.     Tongue: No lesions.     Palate: No mass.     Pharynx: Oropharynx is clear. No oropharyngeal exudate or posterior oropharyngeal erythema.     Tonsils: No tonsillar exudate.  Eyes:     General: Lids are normal. Vision grossly intact. No scleral icterus.       Right eye: No discharge.        Left eye: No discharge.     Extraocular Movements:     Right eye: Normal extraocular motion and no nystagmus.     Left eye: Normal extraocular motion and no nystagmus.     Conjunctiva/sclera: Conjunctivae normal.     Pupils: Pupils are equal, round, and reactive to light.     Funduscopic exam:    Right eye: Red reflex present.        Left eye: Red reflex present. Neck:     Thyroid: No thyroid mass, thyromegaly or thyroid tenderness.     Vascular: Normal carotid pulses. No carotid bruit,  hepatojugular reflux or JVD.     Trachea: Trachea and phonation normal. No tracheal deviation.  Cardiovascular:     Rate and Rhythm: Normal rate and regular rhythm.     Chest Wall: PMI is not displaced.  Pulses: Normal pulses.     Heart sounds: Normal heart sounds, S1 normal and S2 normal. No murmur heard.    No systolic murmur is present.     No diastolic murmur is present.     No friction rub. No gallop. No S3 or S4 sounds.  Pulmonary:     Effort: No respiratory distress.     Breath sounds: Normal breath sounds. No decreased breath sounds, wheezing, rhonchi or rales.  Chest:  Breasts:    Breasts are symmetrical.     Right: Normal. No mass.     Left: Normal. No mass.  Abdominal:     General: Bowel sounds are normal.     Palpations: Abdomen is soft. There is no hepatomegaly, splenomegaly or mass.     Tenderness: There is no abdominal tenderness. There is no guarding or rebound.     Hernia: There is no hernia in the umbilical area or ventral area.  Genitourinary:    Penis: Normal.      Testes: Normal.     Epididymis:     Right: Normal.     Left: Normal.     Prostate: Normal. Not enlarged, not tender and no nodules present.     Rectum: Normal. Guaiac result negative. No mass.  Musculoskeletal:        General: No tenderness. Normal range of motion.     Cervical back: Full passive range of motion without pain, normal range of motion and neck supple.     Right lower leg: No edema.     Left lower leg: No edema.  Lymphadenopathy:     Head:     Right side of head: No submental, submandibular or tonsillar adenopathy.     Left side of head: No submental, submandibular or tonsillar adenopathy.     Cervical: No cervical adenopathy.     Right cervical: No superficial, deep or posterior cervical adenopathy.    Left cervical: No superficial, deep or posterior cervical adenopathy.     Upper Body:     Right upper body: No supraclavicular or axillary adenopathy.     Left upper body: No  supraclavicular or axillary adenopathy.  Skin:    General: Skin is warm.     Capillary Refill: Capillary refill takes less than 2 seconds.     Findings: No rash.  Neurological:     General: No focal deficit present.     Mental Status: He is alert and oriented to person, place, and time.     Cranial Nerves: Cranial nerves 2-12 are intact. No cranial nerve deficit.     Sensory: Sensation is intact.     Motor: Motor function is intact.     Deep Tendon Reflexes: Reflexes are normal and symmetric.  Psychiatric:        Behavior: Behavior is cooperative.     Wt Readings from Last 3 Encounters:  11/05/21 185 lb (83.9 kg)  04/01/21 188 lb (85.3 kg)  11/14/20 182 lb (82.6 kg)    BP 120/82   Pulse 80   Ht 5' 8"  (1.727 m)   Wt 185 lb (83.9 kg)   BMI 28.13 kg/m   Assessment and Plan: Josip Merolla is a 56 y.o. male who presents today for his Complete Annual Exam. He feels fairly well. He reports exercising as able. He reports he is sleeping fairly well.   1. Mild intermittent reactive airway disease without complication Chronic.  Controlled.  Stable.  Continue to monitor today - montelukast (SINGULAIR) 10  MG tablet; Take 1 tablet (10 mg total) by mouth at bedtime.  Dispense: 90 tablet; Refill: 1  2. Annual physical exam Immunizations are reviewed and recommendations provided.   Age appropriate screening tests are discussed. Counseling given for risk factor reduction interventions.  No subjective/objective during history of present illness/concerns, past medical history and medications, review of systems, and physical exam.  Will obtain PSA lipid panel CMP. - PSA - Lipid Panel With LDL/HDL Ratio - Comprehensive Metabolic Panel (CMET) - POCT Occult Blood Stool  3. Oral thrush Patient had initially when he took albuterol which he broke out in oral thrush again.  Feels as if this is due to his albuterol inhaler and cannot fully release.  Any steroid. Previously taken of nystatin oral  suspension cleared up immediately repeat today - nystatin (MYCOSTATIN) 100000 UNIT/ML suspension; Take 5 mLs (500,000 Units total) by mouth 4 (four) times daily.  Dispense: 60 mL; Refill: 0    Otilio Miu, MD

## 2021-11-06 LAB — COMPREHENSIVE METABOLIC PANEL
ALT: 30 IU/L (ref 0–44)
AST: 22 IU/L (ref 0–40)
Albumin/Globulin Ratio: 1.7 (ref 1.2–2.2)
Albumin: 4.5 g/dL (ref 3.8–4.9)
Alkaline Phosphatase: 101 IU/L (ref 44–121)
BUN/Creatinine Ratio: 14 (ref 9–20)
BUN: 13 mg/dL (ref 6–24)
Bilirubin Total: 1.6 mg/dL — ABNORMAL HIGH (ref 0.0–1.2)
CO2: 22 mmol/L (ref 20–29)
Calcium: 9.1 mg/dL (ref 8.7–10.2)
Chloride: 103 mmol/L (ref 96–106)
Creatinine, Ser: 0.95 mg/dL (ref 0.76–1.27)
Globulin, Total: 2.6 g/dL (ref 1.5–4.5)
Glucose: 90 mg/dL (ref 70–99)
Potassium: 4.2 mmol/L (ref 3.5–5.2)
Sodium: 140 mmol/L (ref 134–144)
Total Protein: 7.1 g/dL (ref 6.0–8.5)
eGFR: 95 mL/min/{1.73_m2} (ref >59)

## 2021-11-06 LAB — LIPID PANEL WITH LDL/HDL RATIO
Cholesterol, Total: 196 mg/dL (ref 100–199)
HDL: 48 mg/dL (ref >39)
LDL Chol Calc (NIH): 130 mg/dL — ABNORMAL HIGH (ref 0–99)
LDL/HDL Ratio: 2.7 ratio (ref 0.0–3.6)
Triglycerides: 98 mg/dL (ref 0–149)
VLDL Cholesterol Cal: 18 mg/dL (ref 5–40)

## 2021-11-06 LAB — PSA: Prostate Specific Ag, Serum: 1 ng/mL (ref 0.0–4.0)

## 2022-01-12 ENCOUNTER — Other Ambulatory Visit: Payer: Self-pay | Admitting: Family Medicine

## 2022-01-12 DIAGNOSIS — K121 Other forms of stomatitis: Secondary | ICD-10-CM

## 2022-01-13 ENCOUNTER — Other Ambulatory Visit: Payer: Self-pay | Admitting: Family Medicine

## 2022-01-13 DIAGNOSIS — K121 Other forms of stomatitis: Secondary | ICD-10-CM

## 2022-01-13 NOTE — Telephone Encounter (Signed)
Unable to refill per protocol, last refill by provider 01/13/22. Will refuse duplicate request.   Requested Prescriptions  Pending Prescriptions Disp Refills  . triamcinolone (KENALOG) 0.1 % paste [Pharmacy Med Name: TRIAMCINOLONE ACETON 0.1% MOUTH PAS] 5 g 0    Sig: USE AS DIRECTED IN THE MOUTHOR THROAT DAILY     Off-Protocol Failed - 01/13/2022 10:25 AM      Failed - Medication not assigned to a protocol, review manually.      Passed - Valid encounter within last 12 months    Recent Outpatient Visits          2 months ago Annual physical exam   [facility] [place], Deanna C, MD   9 months ago Pharyngitis, unspecified etiology   [facility] [place], Deanna C, MD   1 year ago Annual physical exam   [facility] [place], Deanna C, MD   1 year ago Chin laceration, subsequent encounter   [facility] at [place], Deanna C, MD   1 year ago Acute prostatitis    [facility], [place], MD      Future Appointments            In 10 months Juline Patch, MD [facility] at [facility], [facility]

## 2022-02-04 ENCOUNTER — Ambulatory Visit: Payer: BC Managed Care – PPO | Admitting: Family Medicine

## 2022-02-04 ENCOUNTER — Encounter: Payer: Self-pay | Admitting: Family Medicine

## 2022-02-04 VITALS — BP 136/80 | HR 94 | Ht 68.0 in | Wt 181.0 lb

## 2022-02-04 DIAGNOSIS — F32A Depression, unspecified: Secondary | ICD-10-CM

## 2022-02-04 DIAGNOSIS — R350 Frequency of micturition: Secondary | ICD-10-CM | POA: Diagnosis not present

## 2022-02-04 DIAGNOSIS — N411 Chronic prostatitis: Secondary | ICD-10-CM

## 2022-02-04 DIAGNOSIS — F419 Anxiety disorder, unspecified: Secondary | ICD-10-CM | POA: Diagnosis not present

## 2022-02-04 LAB — POCT URINALYSIS DIPSTICK
Bilirubin, UA: NEGATIVE
Blood, UA: NEGATIVE
Glucose, UA: NEGATIVE
Ketones, UA: NEGATIVE
Leukocytes, UA: NEGATIVE
Nitrite, UA: NEGATIVE
Protein, UA: NEGATIVE
Spec Grav, UA: <=1.005 — AB (ref 1.010–1.025)
Urobilinogen, UA: 0.2 E.U./dL
pH, UA: 6 (ref 5.0–8.0)

## 2022-02-04 MED ORDER — SULFAMETHOXAZOLE-TRIMETHOPRIM 800-160 MG PO TABS: 1.0000 | ORAL_TABLET | Freq: Two times a day (BID) | ORAL | 0 refills | Status: DC

## 2022-02-04 MED ORDER — SERTRALINE HCL 25 MG PO TABS: 25.0000 mg | ORAL_TABLET | Freq: Every day | ORAL | 3 refills | Status: DC

## 2022-02-04 MED ORDER — FLUCONAZOLE 150 MG PO TABS: 150.0000 mg | ORAL_TABLET | Freq: Once | ORAL | 0 refills | Status: AC

## 2022-02-04 MED ORDER — SULFAMETHOXAZOLE-TRIMETHOPRIM 800-160 MG PO TABS
1.0000 | ORAL_TABLET | Freq: Two times a day (BID) | ORAL | 0 refills | Status: AC
Start: 2022-02-04 — End: 2022-02-18

## 2022-02-04 NOTE — Progress Notes (Signed)
Date:  02/04/2022   Name:  Garrett Barnes   DOB:  13-Jan-1966   MRN:  [id number]   Chief Complaint: urine frequency (Frequency and when finishes he feels like he can't empty completely. Always feels like some urine is always in "head of penis causing burning" chills)  Urinary Frequency  This is a new problem. The current episode started more than 1 month ago. The problem has been waxing and waning. There has been no fever. Associated symptoms include frequency and urgency. Pertinent negatives include no hematuria or hesitancy.    Lab Results  Component Value Date   NA 140 11/05/2021   K 4.2 11/05/2021   CO2 22 11/05/2021   GLUCOSE 90 11/05/2021   BUN 13 11/05/2021   CREATININE 0.95 11/05/2021   CALCIUM 9.1 11/05/2021   EGFR 95 11/05/2021   GFRNONAA 95 08/02/2019   Lab Results  Component Value Date   CHOL 196 11/05/2021   HDL 48 11/05/2021   LDLCALC 130 (H) 11/05/2021   TRIG 98 11/05/2021   CHOLHDL 4.4 11/08/2017   Lab Results  Component Value Date   TSH 1.070 11/04/2020   No results found for: "HGBA1C" Lab Results  Component Value Date   WBC 6.9 11/04/2020   HGB 16.7 11/04/2020   HCT 48.3 11/04/2020   MCV 85 11/04/2020   PLT 172 11/04/2020   Lab Results  Component Value Date   ALT 30 11/05/2021   AST 22 11/05/2021   ALKPHOS 101 11/05/2021   BILITOT 1.6 (H) 11/05/2021   No results found for: "25OHVITD2", "25OHVITD3", "VD25OH"   Review of Systems  HENT:  Negative for postnasal drip.   Respiratory:  Negative for cough, choking, chest tightness, shortness of breath and wheezing.   Cardiovascular: Negative.  Negative for chest pain, palpitations and leg swelling.  Gastrointestinal:  Negative for abdominal distention and anal bleeding.  Genitourinary:  Positive for difficulty urinating, dysuria, frequency and urgency. Negative for hematuria and hesitancy.    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(608.89) 09/18/2013    Allergies  Allergen Reactions   Latex Hives   Penicillins Rash    Diarrhea    Past Surgical History:  Procedure Laterality Date   COLONOSCOPY WITH PROPOFOL N/A 11/10/2017   Procedure: COLONOSCOPY WITH PROPOFOL;  Surgeon: Lin Landsman, MD;  Location: [facility];  Service: Endoscopy;  Laterality: N/A;   COLONOSCOPY WITH PROPOFOL N/A 11/14/2020   Procedure: COLONOSCOPY WITH PROPOFOL;  Surgeon: Lin Landsman, MD;  Location: [facility];  Service: Endoscopy;  Laterality: N/A;   PILONIDAL CYST EXCISION     WISDOM TOOTH EXTRACTION      Social History   Tobacco Use   Smoking status: Never   Smokeless tobacco: Never  Substance Use Topics   Alcohol use: No    Alcohol/week: 0.0 standard drinks of alcohol   Drug use: No     Medication list has been reviewed and updated.  Current Meds  Medication Sig   Multiple Vitamin (MULTIVITAMIN) capsule Take 1 capsule by mouth daily.   tadalafil (CIALIS) 5 MG tablet Take 5  mg by mouth.   triamcinolone (KENALOG) 0.1 % paste USE AS DIRECTED IN THE MOUTHOR THROAT DAILY   valACYclovir (VALTREX) 500 MG tablet Take 1 tablet (500 mg total) by mouth 2 (two) times daily.   [DISCONTINUED] nystatin (MYCOSTATIN) 100000 UNIT/ML suspension Take 5 mLs (500,000 Units total) by mouth 4 (four) times daily.       02/04/2022   11:30 AM 11/05/2021    9:42 AM 11/04/2020    9:58 AM 08/05/2020    1:21 PM  GAD 7 : Generalized Anxiety Score  Nervous, Anxious, on Edge 0 2 0 0  Control/stop worrying 3 3 0 0  Worry too much - different things 2 3 0 0  Trouble relaxing 1 3 0 0  Restless 2  1 0 0  Easily annoyed or irritable 3 2 0 0  Afraid - awful might happen 1 2 0 0  Total GAD 7 Score 12 16 0 0  Anxiety Difficulty Somewhat difficult Somewhat difficult         02/04/2022   11:28 AM 11/05/2021    9:41 AM 11/04/2020    9:57 AM  Depression screen PHQ 2/9  Decreased Interest 0 1 0  Down, Depressed, Hopeless 2 1 0  PHQ - 2 Score 2 2 0  Altered sleeping 3 2 0  Tired, decreased energy 3 2 0  Change in appetite 1 0 0  Feeling bad or failure about yourself  0 2 0  Trouble concentrating 0 2 0  Moving slowly or fidgety/restless 0 1 0  Suicidal thoughts 0 0 0  PHQ-9 Score 9 11 0  Difficult doing work/chores Not difficult at all Somewhat difficult     BP Readings from Last 3 Encounters:  02/04/22 136/80  11/05/21 120/82  04/01/21 110/70    Physical Exam Vitals and nursing note reviewed.  HENT:     Head: Normocephalic.     Right Ear: External ear normal.     Left Ear: External ear normal.     Nose: Nose normal.  Eyes:     General: No scleral icterus.       Right eye: No discharge.        Left eye: No discharge.     Conjunctiva/sclera: Conjunctivae normal.     Pupils: Pupils are equal, round, and reactive to light.  Neck:     Thyroid: No thyromegaly.     Vascular: No JVD.     Trachea: No tracheal deviation.  Cardiovascular:     Rate and Rhythm: Normal rate and regular rhythm.     Heart sounds: Normal heart sounds. No murmur heard.    No friction rub. No gallop.  Pulmonary:     Effort: No respiratory distress.     Breath sounds: Normal breath sounds. No wheezing or rales.  Abdominal:     General: Bowel sounds are normal.     Palpations: Abdomen is soft. There is no mass.     Tenderness: There is no abdominal tenderness. There is no guarding or rebound.  Musculoskeletal:        General: No tenderness. Normal range of motion.     Cervical back: Normal range of motion and neck supple.  Lymphadenopathy:     Cervical: No cervical adenopathy.  Skin:    General:  Skin is warm.     Findings: No rash.  Neurological:     Mental Status: He is alert.     Wt Readings from Last 3 Encounters:  02/04/22 181  lb (82.1 kg)  11/05/21 185 lb (83.9 kg)  04/01/21 188 lb (85.3 kg)    BP 136/80   Pulse 94   Ht _0  (1.727 m)   Wt 181 lb (82.1 kg)   SpO2 98%   BMI 27.52 kg/m   Assessment and Plan:  1. Urine frequency New onset.  Persistent.  DRE notes the prostate to be boggy without tenderness consistent with a subacute prostatitis.  We will treat with Diflucan and sulfamethoxazole trimethoprim 800-1 60 twice a day - POCT urinalysis dipstick - fluconazole (DIFLUCAN) 150 MG tablet; Take 1 tablet (150 mg total) by mouth once for 1 dose.  Dispense: 1 tablet; Refill: 0  2. Chronic prostatitis New onset.  Persistent..  Stable.  DRE is consistent with subacute prostatitis.  We will treat with Septra DS twice a day for 10 days. - sulfamethoxazole-trimethoprim (BACTRIM DS) 800-160 MG tablet; Take 1 tablet by mouth 2 (two) times daily for 10 days.  Dispense: 20 tablet; Refill: 0  3. Anxiety and depression New onset.  Recently has [facility] into the current circumstances of GAD score of 12 PHQ score 9.  Patient would like to try Zoloft and we will initiate at 12-1/2 mg one half of a 25 once a day for 2 weeks and then is to 25 and then we will reevaluate in 6 weeks. - sertraline (ZOLOFT) 25 MG tablet; Take 1 tablet (25 mg total) by mouth daily.  Dispense: 30 tablet; Refill: 3    Otilio Miu, MD

## 2022-03-12 ENCOUNTER — Ambulatory Visit: Payer: BC Managed Care – PPO | Admitting: Family Medicine

## 2022-03-13 ENCOUNTER — Ambulatory Visit: Payer: BC Managed Care – PPO | Admitting: Family Medicine

## 2022-03-17 ENCOUNTER — Encounter: Payer: Self-pay | Admitting: Family Medicine

## 2022-03-17 ENCOUNTER — Ambulatory Visit (INDEPENDENT_AMBULATORY_CARE_PROVIDER_SITE_OTHER): Payer: BC Managed Care – PPO | Admitting: Family Medicine

## 2022-03-17 VITALS — BP 120/76 | HR 86 | Ht 68.0 in | Wt 182.0 lb

## 2022-03-17 DIAGNOSIS — F419 Anxiety disorder, unspecified: Secondary | ICD-10-CM | POA: Diagnosis not present

## 2022-03-17 DIAGNOSIS — F32A Depression, unspecified: Secondary | ICD-10-CM | POA: Diagnosis not present

## 2022-03-17 MED ORDER — SERTRALINE HCL 25 MG PO TABS: 12.5000 mg | ORAL_TABLET | Freq: Every day | ORAL | 1 refills | Status: DC

## 2022-03-17 NOTE — Progress Notes (Signed)
Date:  03/17/2022   Name:  Garrett Barnes   DOB:  11/23/1965   MRN:  [id number]   Chief Complaint: Anxiety (Feeling better on 12.56m qday- has maintained on that dose)  Anxiety Presents for follow-up visit. Patient reports no chest pain, depressed mood, excessive worry, irritability, nervous/anxious behavior, palpitations, panic or shortness of breath. The severity of symptoms is mild.      Lab Results  Component Value Date   NA 140 11/05/2021   K 4.2 11/05/2021   CO2 22 11/05/2021   GLUCOSE 90 11/05/2021   BUN 13 11/05/2021   CREATININE 0.95 11/05/2021   CALCIUM 9.1 11/05/2021   EGFR 95 11/05/2021   GFRNONAA 95 08/02/2019   Lab Results  Component Value Date   CHOL 196 11/05/2021   HDL 48 11/05/2021   LDLCALC 130 (H) 11/05/2021   TRIG 98 11/05/2021   CHOLHDL 4.4 11/08/2017   Lab Results  Component Value Date   TSH 1.070 11/04/2020   No results found for: "HGBA1C" Lab Results  Component Value Date   WBC 6.9 11/04/2020   HGB 16.7 11/04/2020   HCT 48.3 11/04/2020   MCV 85 11/04/2020   PLT 172 11/04/2020   Lab Results  Component Value Date   ALT 30 11/05/2021   AST 22 11/05/2021   ALKPHOS 101 11/05/2021   BILITOT 1.6 (H) 11/05/2021   No results found for: "25OHVITD2", "25OHVITD3", "VD25OH"   Review of Systems  Constitutional:  Negative for irritability.  Respiratory:  Negative for cough and shortness of breath.   Cardiovascular:  Negative for chest pain, palpitations and leg swelling.  Psychiatric/Behavioral:  The patient is not nervous/anxious.     Patient Active Problem List   Diagnosis Date Noted   History of colonic polyps    Diarrhea    Other male erectile dysfunction 06/23/2019   Incomplete emptying of bladder 02/03/2017   Family history of prostate cancer 02/02/2017   Benign localized hyperplasia of prostate with urinary obstruction 09/18/2013   Chronic prostatitis 09/18/2013   Increased frequency of urination 09/18/2013   Other specified  disorder of male genital organs(608.89) 09/18/2013    Allergies  Allergen Reactions   Latex Hives   Penicillins Rash    Diarrhea    Past Surgical History:  Procedure Laterality Date   COLONOSCOPY WITH PROPOFOL N/A 11/10/2017   Procedure: COLONOSCOPY WITH PROPOFOL;  Surgeon: VLin Landsman MD;  Location: [facility]  Service: Endoscopy;  Laterality: N/A;   COLONOSCOPY WITH PROPOFOL N/A 11/14/2020   Procedure: COLONOSCOPY WITH PROPOFOL;  Surgeon: VLin Landsman MD;  Location: [facility]  Service: Endoscopy;  Laterality: N/A;   PILONIDAL CYST EXCISION     WISDOM TOOTH EXTRACTION      Social History   Tobacco Use   Smoking status: Never   Smokeless tobacco: Never  Substance Use Topics   Alcohol use: No    Alcohol/week: 0.0 standard drinks of alcohol   Drug use: No     Medication list has been reviewed and updated.  Current Meds  Medication Sig   montelukast (SINGULAIR) 10 MG tablet Take 1 tablet (10 mg total) by mouth at bedtime.   Multiple Vitamin (MULTIVITAMIN) capsule Take 1 capsule by mouth daily.   sertraline (ZOLOFT) 25 MG tablet Take 1 tablet (25 mg total) by mouth daily. (Patient taking differently: Take 12.5 mg by mouth daily.)   tadalafil (CIALIS) 5 MG tablet Take 5 mg by mouth.   triamcinolone (KENALOG) 0.1 %  paste USE AS DIRECTED IN THE MOUTHOR THROAT DAILY   valACYclovir (VALTREX) 500 MG tablet Take 1 tablet (500 mg total) by mouth 2 (two) times daily.       03/17/2022   10:34 AM 02/04/2022   11:30 AM 11/05/2021    9:42 AM 11/04/2020    9:58 AM  GAD 7 : Generalized Anxiety Score  Nervous, Anxious, on Edge 0 0 2 0  Control/stop worrying 0 3 3 0  Worry too much - different things 0 2 3 0  Trouble relaxing 0 1 3 0  Restless 0 2 1 0  Easily annoyed or irritable 0 3 2 0  Afraid - awful might happen 0 1 2 0  Total GAD 7 Score 0 12 16 0  Anxiety Difficulty Not difficult at all Somewhat difficult Somewhat difficult         03/17/2022   10:34 AM 02/04/2022   11:28 AM 11/05/2021    9:41 AM  Depression screen PHQ 2/9  Decreased Interest 0 0 1  Down, Depressed, Hopeless 0 2 1  PHQ - 2 Score 0 2 2  Altered sleeping 0 3 2  Tired, decreased energy 0 3 2  Change in appetite 0 1 0  Feeling bad or failure about yourself  0 0 2  Trouble concentrating 0 0 2  Moving slowly or fidgety/restless 0 0 1  Suicidal thoughts 0 0 0  PHQ-9 Score 0 9 11  Difficult doing work/chores Not difficult at all Not difficult at all Somewhat difficult    BP Readings from Last 3 Encounters:  03/17/22 120/76  02/04/22 136/80  11/05/21 120/82    Physical Exam Vitals and nursing note reviewed.  HENT:     Right Ear: Tympanic membrane and ear canal normal.     Left Ear: Tympanic membrane and ear canal normal.     Mouth/Throat:     Mouth: Mucous membranes are moist.  Cardiovascular:     Heart sounds: No murmur heard.    No friction rub. No gallop.  Pulmonary:     Effort: Pulmonary effort is normal. No respiratory distress.     Breath sounds: Normal breath sounds. No stridor. No wheezing, rhonchi or rales.  Chest:     Chest wall: No tenderness.  Abdominal:     Palpations: There is no hepatomegaly or splenomegaly.     Tenderness: There is no abdominal tenderness. There is no guarding.  Musculoskeletal:     Cervical back: Normal range of motion.     Wt Readings from Last 3 Encounters:  03/17/22 182 lb (82.6 kg)  02/04/22 181 lb (82.1 kg)  11/05/21 185 lb (83.9 kg)    BP 120/76   Pulse 86   Ht _0  (1.727 m)   Wt 182 lb (82.6 kg)   SpO2 96%   BMI 27.67 kg/m   Assessment and Plan:  1. Anxiety and depression Relatively new onset.  Approved.  Controlled.  Patient has remained at 12.5 mg daily and is doing significantly better with the GAD score from 12-0.  Patient will continue on current dosing and will return in 6 months and we will discuss continuance of the medication. - sertraline (ZOLOFT) 25 MG tablet; Take 0.5  tablets (12.5 mg total) by mouth daily.  Dispense: 45 tablet; Refill: 1 relatively new onset.   Otilio Miu, MD

## 2022-03-17 NOTE — Patient Instructions (Signed)

## 2022-04-27 ENCOUNTER — Ambulatory Visit: Payer: Self-pay

## 2022-04-27 ENCOUNTER — Encounter: Payer: Self-pay | Admitting: Family Medicine

## 2022-04-27 ENCOUNTER — Ambulatory Visit: Payer: BC Managed Care – PPO | Admitting: Family Medicine

## 2022-04-27 VITALS — BP 110/74 | HR 77 | Ht 68.0 in | Wt 188.0 lb

## 2022-04-27 DIAGNOSIS — H1033 Unspecified acute conjunctivitis, bilateral: Secondary | ICD-10-CM

## 2022-04-27 MED ORDER — SULFACETAMIDE SODIUM 10 % OP SOLN: 1.0000 [drp] | OPHTHALMIC | 0 refills | Status: DC

## 2022-04-27 NOTE — Telephone Encounter (Signed)
  Chief Complaint: Left eye lower lid swelling Symptoms: above Frequency:  Pertinent Negatives: Patient denies fever, discharge Disposition: '[]'$ ED /'[]'$ Urgent Care (no appt availability in office) / '[x]'$ Appointment(In office/virtual)/ '[]'$  [facility]/ '[]'$ Home Care/ '[]'$ Refused Recommended Disposition /'[]'$ [facility]/ '[]'$  Follow-up with PCP Additional Notes: PT states that the lower lid of his left eye is swollen and painful.    Reason for Disposition  MODERATE-SEVERE eyelid swelling on one side  (Exception: Due to a mosquito bite.)  Answer Assessment - Initial Assessment Questions 1. ONSET: "When did the swelling start?" (e.g., minutes, hours, days)     unsure 2. LOCATION: "What part of the eyelids is swollen?"     Lower lid left eye 3. SEVERITY: "How swollen is it?"     Mild/moderate 4. ITCHING: "Is there any itching?" If Yes, ask: "How much?"   (Scale 1-10; mild, moderate or severe)      5. PAIN: "Is the swelling painful to touch?" If Yes, ask: "How painful is it?"   (Scale 1-10; mild, moderate or severe)     yes 6. FEVER: "Do you have a fever?" If Yes, ask: "What is it, how was it measured, and when did it start?"      no 7. CAUSE: "What do you think is causing the swelling?"     unsure 8. RECURRENT SYMPTOM: "Have you had eyelid swelling before?" If Yes, ask: "When was the last time?" "What happened that time?"      9. OTHER SYMPTOMS: "Do you have any other symptoms?" (e.g., blurred vision, eye discharge, rash, runny nose)      10. PREGNANCY: "Is there any chance you are pregnant?" "When was your last menstrual period?"  Protocols used: Eye - Swelling-A-AH

## 2022-04-27 NOTE — Progress Notes (Signed)
Date:  04/27/2022   Name:  Garrett Barnes   DOB:  07-18-1965   MRN:  [id number]   Chief Complaint: Eye Pain (L) eye pain- stinging pain, red)  Eye Pain  Pertinent negatives include no fever or nausea.    Lab Results  Component Value Date   NA 140 11/05/2021   K 4.2 11/05/2021   CO2 22 11/05/2021   GLUCOSE 90 11/05/2021   BUN 13 11/05/2021   CREATININE 0.95 11/05/2021   CALCIUM 9.1 11/05/2021   EGFR 95 11/05/2021   GFRNONAA 95 08/02/2019   Lab Results  Component Value Date   CHOL 196 11/05/2021   HDL 48 11/05/2021   LDLCALC 130 (H) 11/05/2021   TRIG 98 11/05/2021   CHOLHDL 4.4 11/08/2017   Lab Results  Component Value Date   TSH 1.070 11/04/2020   No results found for: "HGBA1C" Lab Results  Component Value Date   WBC 6.9 11/04/2020   HGB 16.7 11/04/2020   HCT 48.3 11/04/2020   MCV 85 11/04/2020   PLT 172 11/04/2020   Lab Results  Component Value Date   ALT 30 11/05/2021   AST 22 11/05/2021   ALKPHOS 101 11/05/2021   BILITOT 1.6 (H) 11/05/2021   No results found for: "25OHVITD2", "25OHVITD3", "VD25OH"   Review of Systems  Constitutional:  Negative for chills and fever.  HENT:  Negative for drooling, ear discharge, ear pain and sore throat.   Eyes:  Positive for pain.  Respiratory:  Negative for cough, shortness of breath and wheezing.   Cardiovascular:  Negative for chest pain, palpitations and leg swelling.  Gastrointestinal:  Negative for abdominal pain, blood in stool, constipation, diarrhea and nausea.  Endocrine: Negative for polydipsia.  Genitourinary:  Negative for dysuria, frequency, hematuria and urgency.  Musculoskeletal:  Negative for back pain, myalgias and neck pain.  Skin:  Negative for rash.  Allergic/Immunologic: Negative for environmental allergies.  Neurological:  Negative for dizziness and headaches.  Hematological:  Does not bruise/bleed easily.  Psychiatric/Behavioral:  Negative for suicidal ideas. The patient is not  nervous/anxious.     Patient Active Problem List   Diagnosis Date Noted   History of colonic polyps    Diarrhea    Other male erectile dysfunction 06/23/2019   Incomplete emptying of bladder 02/03/2017   Family history of prostate cancer 02/02/2017   Benign localized hyperplasia of prostate with urinary obstruction 09/18/2013   Chronic prostatitis 09/18/2013   Increased frequency of urination 09/18/2013   Other specified disorder of male genital organs(608.89) 09/18/2013    Allergies  Allergen Reactions   Latex Hives   Penicillins Rash    Diarrhea    Past Surgical History:  Procedure Laterality Date   COLONOSCOPY WITH PROPOFOL N/A 11/10/2017   Procedure: COLONOSCOPY WITH PROPOFOL;  Surgeon: Lin Landsman, MD;  Location: [facility];  Service: Endoscopy;  Laterality: N/A;   COLONOSCOPY WITH PROPOFOL N/A 11/14/2020   Procedure: COLONOSCOPY WITH PROPOFOL;  Surgeon: Lin Landsman, MD;  Location: [place];  Service: Endoscopy;  Laterality: N/A;   PILONIDAL CYST EXCISION     WISDOM TOOTH EXTRACTION      Social History   Tobacco Use   Smoking status: Never   Smokeless tobacco: Never  Substance Use Topics   Alcohol use: No    Alcohol/week: 0.0 standard drinks of alcohol   Drug use: No     Medication list has been reviewed and updated.  Current Meds  Medication Sig  montelukast (SINGULAIR) 10 MG tablet Take 1 tablet (10 mg total) by mouth at bedtime.   Multiple Vitamin (MULTIVITAMIN) capsule Take 1 capsule by mouth daily.   sertraline (ZOLOFT) 25 MG tablet Take 0.5 tablets (12.5 mg total) by mouth daily.   tadalafil (CIALIS) 5 MG tablet Take 5 mg by mouth.   triamcinolone (KENALOG) 0.1 % paste USE AS DIRECTED IN THE MOUTHOR THROAT DAILY   valACYclovir (VALTREX) 500 MG tablet Take 1 tablet (500 mg total) by mouth 2 (two) times daily.       04/27/2022    3:33 PM 03/17/2022   10:34 AM 02/04/2022   11:30 AM 11/05/2021    9:42 AM  GAD 7 :  Generalized Anxiety Score  Nervous, Anxious, on Edge 0 0 0 2  Control/stop worrying 0 0 3 3  Worry too much - different things 0 0 2 3  Trouble relaxing 0 0 1 3  Restless 0 0 2 1  Easily annoyed or irritable 0 0 3 2  Afraid - awful might happen 0 0 1 2  Total GAD 7 Score 0 0 12 16  Anxiety Difficulty Not difficult at all Not difficult at all Somewhat difficult Somewhat difficult       04/27/2022    3:33 PM 03/17/2022   10:34 AM 02/04/2022   11:28 AM  Depression screen PHQ 2/9  Decreased Interest 0 0 0  Down, Depressed, Hopeless 0 0 2  PHQ - 2 Score 0 0 2  Altered sleeping 0 0 3  Tired, decreased energy 0 0 3  Change in appetite 0 0 1  Feeling bad or failure about yourself  0 0 0  Trouble concentrating 0 0 0  Moving slowly or fidgety/restless 0 0 0  Suicidal thoughts 0 0 0  PHQ-9 Score 0 0 9  Difficult doing work/chores Not difficult at all Not difficult at all Not difficult at all    BP Readings from Last 3 Encounters:  04/27/22 110/74  03/17/22 120/76  02/04/22 136/80    Physical Exam Vitals and nursing note reviewed.  HENT:     Head: Normocephalic.     Right Ear: Tympanic membrane and external ear normal.     Left Ear: Tympanic membrane and external ear normal.     Nose: Nose normal.     Mouth/Throat:     Mouth: Mucous membranes are moist.  Eyes:     General: Lids are normal. Lids are everted, no foreign bodies appreciated. No scleral icterus.       Right eye: No discharge.        Left eye: No discharge.     Conjunctiva/sclera:     Right eye: Right conjunctiva is injected.     Left eye: Left conjunctiva is injected.     Pupils: Pupils are equal, round, and reactive to light.     Comments: No fb  Neck:     Thyroid: No thyromegaly.     Vascular: No JVD.     Trachea: No tracheal deviation.  Cardiovascular:     Rate and Rhythm: Normal rate and regular rhythm.     Heart sounds: Normal heart sounds. No murmur heard.    No friction rub. No gallop.  Pulmonary:      Effort: No respiratory distress.     Breath sounds: Normal breath sounds. No wheezing, rhonchi or rales.  Abdominal:     Palpations: Abdomen is soft.  Musculoskeletal:        General: No  tenderness. Normal range of motion.     Cervical back: Normal range of motion and neck supple.  Lymphadenopathy:     Cervical: No cervical adenopathy.  Skin:    Findings: No rash.  Neurological:     Mental Status: He is alert.     Wt Readings from Last 3 Encounters:  04/27/22 188 lb (85.3 kg)  03/17/22 182 lb (82.6 kg)  02/04/22 181 lb (82.1 kg)    BP 110/74   Pulse 77   Ht '5\' 8"'$  (1.727 m)   Wt 188 lb (85.3 kg)   SpO2 97%   BMI 28.59 kg/m   Assessment and Plan: 1. Acute bacterial conjunctivitis of both eyes Acute.  Persistent.  Stable.  Bilateral conjunctival erythema consistent with conjunctivitis.  Will treat with sodium sulamyd 10% ophthalmic drops 1 drop every 3-4 hours while awake.    Otilio Miu, MD

## 2022-06-12 ENCOUNTER — Telehealth: Payer: BC Managed Care – PPO | Admitting: Family Medicine

## 2022-06-12 ENCOUNTER — Ambulatory Visit: Payer: Self-pay

## 2022-06-12 DIAGNOSIS — J019 Acute sinusitis, unspecified: Secondary | ICD-10-CM

## 2022-06-12 MED ORDER — DOXYCYCLINE HYCLATE 100 MG PO TABS
100.0000 mg | ORAL_TABLET | Freq: Two times a day (BID) | ORAL | 0 refills | Status: AC
Start: 2022-06-14 — End: 2022-06-24

## 2022-06-12 NOTE — Patient Instructions (Addendum)
Venetia Constable, thank you for joining Perlie Mayo, NP for today's virtual visit.  While this provider is not your primary care provider (PCP), if your PCP is located in our provider database this encounter information will be shared with them immediately following your visit.   [place] account gives you access to today's visit and all your visits, tests, and labs performed at [facility] " click here if you don't have a [facility] account or go to [email or web address]  Consent: (Patient) Garrett Barnes provided verbal consent for this virtual visit at the beginning of the encounter.  Current Medications:  Current Outpatient Medications:    [START ON 06/14/2022] doxycycline (VIBRA-TABS) 100 MG tablet, Take 1 tablet (100 mg total) by mouth 2 (two) times daily for 10 days., Disp: 20 tablet, Rfl: 0   montelukast (SINGULAIR) 10 MG tablet, Take 1 tablet (10 mg total) by mouth at bedtime., Disp: 90 tablet, Rfl: 1   Multiple Vitamin (MULTIVITAMIN) capsule, Take 1 capsule by mouth daily., Disp: , Rfl:    sertraline (ZOLOFT) 25 MG tablet, Take 0.5 tablets (12.5 mg total) by mouth daily., Disp: 45 tablet, Rfl: 1   sulfacetamide (BLEPH-10) 10 % ophthalmic solution, Place 1 drop into both eyes every 3 (three) hours., Disp: 15 mL, Rfl: 0   tadalafil (CIALIS) 5 MG tablet, Take 5 mg by mouth., Disp: , Rfl:    triamcinolone (KENALOG) 0.1 % paste, USE AS DIRECTED IN THE MOUTHOR THROAT DAILY, Disp: 5 g, Rfl: 0   valACYclovir (VALTREX) 500 MG tablet, Take 1 tablet (500 mg total) by mouth 2 (two) times daily., Disp: 14 tablet, Rfl: 0   Medications ordered in this encounter:  Meds ordered this encounter  Medications   doxycycline (VIBRA-TABS) 100 MG tablet    Sig: Take 1 tablet (100 mg total) by mouth 2 (two) times daily for 10 days.    Dispense:  20 tablet    Refill:  0    Order Specific Question:   Supervising Provider    Answer:   Chase Picket [id number]     *If  you need refills on other medications prior to your next appointment, please contact your pharmacy*  Follow-Up: Call back or seek an in-person evaluation if the symptoms worsen or if the condition fails to improve as anticipated.  [place] [phone]  Other Instructions -Take meds as prescribed- delayed antibiotic for Sun/Mon if not better -Flonase, Zyrtec, and OTC cough med -Rest -Use a cool mist humidifier especially during the winter months when heat dries out the air. - Use saline nose sprays frequently to help soothe nasal passages and promote drainage. -Saline irrigations of the nose can be very helpful if done frequently.             * 4X daily for 1 week*             * Use of a nettie pot can be helpful with this.  *Follow directions with this* *Boiled or distilled water only -stay hydrated by drinking plenty of fluids - Keep thermostat turn down low to prevent drying out sinuses - For any cough or congestion- robitussin DM or Delsym as needed - For fever or aches or pains- take tylenol or ibuprofen as directed on bottle             * for fevers greater than 101 orally you may alternate ibuprofen and tylenol every 3 hours.  If you do not improve  you will need a follow up visit in person.                   If you have been instructed to have an in-person evaluation today at a local Urgent Care facility, please use the link below. It will take you to a list of all of our available [facility], including address, phone number and hours of operation. Please do not delay care.  [facility]  If you or a family member do not have a primary care provider, use the link below to schedule a visit and establish care. When you choose a [place] primary care physician or advanced practice provider, you gain a long-term partner in health. Find a Primary Care Provider  Learn more about [facility] in-office and virtual care  options: [facility]

## 2022-06-12 NOTE — Telephone Encounter (Signed)
Summary: Sore throat, congestion advice   Pt is calling to report that sx-sore throat, congestion, discharge, hoarse throat. Pt is unable come into the office. Interested in virtual option of appt. Dr. Ronnald Ramp does not do virtual appts         Chief Complaint: Sinus pain, congestion. Sore throat.Yellow mucus.Requests VV. Symptoms: Above Frequency: 4 days ago Pertinent Negatives: Patient denies fever Disposition: [] ED /[] Urgent Care (no appt availability in office) / [] Appointment(In office/virtual)/ [x]  [facility]/ [] Home Care/ [] Refused Recommended Disposition /[] [facility]/ []  Follow-up with PCP Additional Notes: Levada Dy in practice reports cannot do Virtual visit for pt. If PCP has openings in the office. Pt. Cannot come in today due to work schedule  Reason for Disposition  Lots of coughing  Answer Assessment - Initial Assessment Questions 1. LOCATION: "Where does it hurt?"      Face 2. ONSET: "When did the sinus pain start?"  (e.g., hours, days)      4 days ago 3. SEVERITY: "How bad is the pain?"   (Scale 1-10; mild, moderate or severe)   - MILD (1-3): doesn't interfere with normal activities    - MODERATE (4-7): interferes with normal activities (e.g., work or school) or awakens from sleep   - SEVERE (8-10): excruciating pain and patient unable to do any normal activities        Moderate 4. RECURRENT SYMPTOM: "Have you ever had sinus problems before?" If Yes, ask: "When was the last time?" and "What happened that time?"      Yes 5. NASAL CONGESTION: "Is the nose blocked?" If Yes, ask: "Can you open it or must you breathe through your mouth?"     Yes 6. NASAL DISCHARGE: "Do you have discharge from your nose?" If so ask, "What color?"     Thick yellow 7. FEVER: "Do you have a fever?" If Yes, ask: "What is it, how was it measured, and when did it start?"      No 8. OTHER SYMPTOMS: "Do you have any other symptoms?" (e.g., sore throat, cough, earache,  difficulty breathing)     Sore throat 9. PREGNANCY: "Is there any chance you are pregnant?" "When was your last menstrual period?"     N/a  Protocols used: Sinus Pain or Congestion-A-AH

## 2022-06-12 NOTE — Progress Notes (Signed)
Virtual Visit Consent   Garrett Barnes, you are scheduled for a virtual visit with a [place] provider today. Just as with appointments in the office, your consent must be obtained to participate. Your consent will be active for this visit and any virtual visit you may have with one of our providers in the next 365 days. If you have a MyChart account, a copy of this consent can be sent to you electronically.  As this is a virtual visit, video technology does not allow for your provider to perform a traditional examination. This may limit your provider's ability to fully assess your condition. If your provider identifies any concerns that need to be evaluated in person or the need to arrange testing (such as labs, EKG, etc.), we will make arrangements to do so. Although advances in technology are sophisticated, we cannot ensure that it will always work on either your end or our end. If the connection with a video visit is poor, the visit may have to be switched to a telephone visit. With either a video or telephone visit, we are not always able to ensure that we have a secure connection.  By engaging in this virtual visit, you consent to the provision of healthcare and authorize for your insurance to be billed (if applicable) for the services provided during this visit. Depending on your insurance coverage, you may receive a charge related to this service.  I need to obtain your verbal consent now. Are you willing to proceed with your visit today? Babu Gohn has provided verbal consent on 06/12/2022 for a virtual visit (video or telephone). Perlie Mayo, NP  Date: 06/12/2022 10:14 AM  Virtual Visit via Video Note   I, Perlie Mayo, connected with  Garrett Barnes  ([id number], 06-Jan-1966) on 06/12/22 at 10:15 AM EDT by a video-enabled telemedicine application and verified that I am speaking with the correct person using two identifiers.  Location: Patient: Virtual Visit Location Patient:  Home Provider: Virtual Visit Location Provider: Home Office   I discussed the limitations of evaluation and management by telemedicine and the availability of in person appointments. The patient expressed understanding and agreed to proceed.    History of Present Illness: Garrett Barnes is a 57 y.o. who identifies as a male who was assigned male at birth, and is being seen today for sinus infection.  Onset was Monday- with PND, sore throat Associated symptoms are runny nose, congestion, headache, plugged ears, Wednesday started to have tender in lower throat, mucus is bloody and cough.  Modifying factors are mucinex and nasal decongestant.  Denies chest pain, shortness of breath, fevers, chill   Problems:  Patient Active Problem List   Diagnosis Date Noted   History of colonic polyps    Diarrhea    Other male erectile dysfunction 06/23/2019   Incomplete emptying of bladder 02/03/2017   Family history of prostate cancer 02/02/2017   Benign localized hyperplasia of prostate with urinary obstruction 09/18/2013   Chronic prostatitis 09/18/2013   Increased frequency of urination 09/18/2013   Other specified disorder of male genital organs(608.89) 09/18/2013    Allergies:  Allergies  Allergen Reactions   Latex Hives   Penicillins Rash    Diarrhea   Medications:  Current Outpatient Medications:    montelukast (SINGULAIR) 10 MG tablet, Take 1 tablet (10 mg total) by mouth at bedtime., Disp: 90 tablet, Rfl: 1   Multiple Vitamin (MULTIVITAMIN) capsule, Take 1 capsule by mouth daily., Disp: , Rfl:  sertraline (ZOLOFT) 25 MG tablet, Take 0.5 tablets (12.5 mg total) by mouth daily., Disp: 45 tablet, Rfl: 1   sulfacetamide (BLEPH-10) 10 % ophthalmic solution, Place 1 drop into both eyes every 3 (three) hours., Disp: 15 mL, Rfl: 0   tadalafil (CIALIS) 5 MG tablet, Take 5 mg by mouth., Disp: , Rfl:    triamcinolone (KENALOG) 0.1 % paste, USE AS DIRECTED IN THE MOUTHOR THROAT DAILY, Disp: 5  g, Rfl: 0   valACYclovir (VALTREX) 500 MG tablet, Take 1 tablet (500 mg total) by mouth 2 (two) times daily., Disp: 14 tablet, Rfl: 0  Observations/Objective: Patient is well-developed, well-nourished in no acute distress.  Resting comfortably  at home.  Head is normocephalic, atraumatic.  No labored breathing.  Speech is clear and coherent with logical content.  Patient is alert and oriented at baseline.    Assessment and Plan: 1. Acute non-recurrent sinusitis, unspecified location  - doxycycline (VIBRA-TABS) 100 MG tablet; Take 1 tablet (100 mg total) by mouth 2 (two) times daily for 10 days.  Dispense: 20 tablet; Refill: 0  Take meds as prescribed- delayed antibiotic for Sun/Mon if not better -Flonase, Zyrtec, and OTC cough med -Rest -Use a cool mist humidifier especially during the winter months when heat dries out the air. - Use saline nose sprays frequently to help soothe nasal passages and promote drainage. -Saline irrigations of the nose can be very helpful if done frequently.             * 4X daily for 1 week*             * Use of a nettie pot can be helpful with this.  *Follow directions with this* *Boiled or distilled water only -stay hydrated by drinking plenty of fluids - Keep thermostat turn down low to prevent drying out sinuses - For any cough or congestion- robitussin DM or Delsym as needed - For fever or aches or pains- take tylenol or ibuprofen as directed on bottle             * for fevers greater than 101 orally you may alternate ibuprofen and tylenol every 3 hours.  If you do not improve you will need a follow up visit in person.              Reviewed side effects, risks and benefits of medication.    Patient acknowledged agreement and understanding of the plan.   Past Medical, Surgical, Social History, Allergies, and Medications have been Reviewed.    Follow Up Instructions: I discussed the assessment and treatment plan with the patient. The patient  was provided an opportunity to ask questions and all were answered. The patient agreed with the plan and demonstrated an understanding of the instructions.  A copy of instructions were sent to the patient via MyChart unless otherwise noted below.     The patient was advised to call back or seek an in-person evaluation if the symptoms worsen or if the condition fails to improve as anticipated.  Time:  I spent 7 minutes with the patient via telehealth technology discussing the above problems/concerns.    Perlie Mayo, NP

## 2022-08-02 IMAGING — CR DG KNEE COMPLETE 4+V*L*
4 series · 4 of 4 positions shown · non-contrast
Comparison: None.

CLINICAL DATA: Contusion of left knee, subsequent encounter. Fall
off stage. Left knee injury. Ecchymosis laterally.

EXAM:
LEFT KNEE - COMPLETE 4+ VIEW

[knee ap]
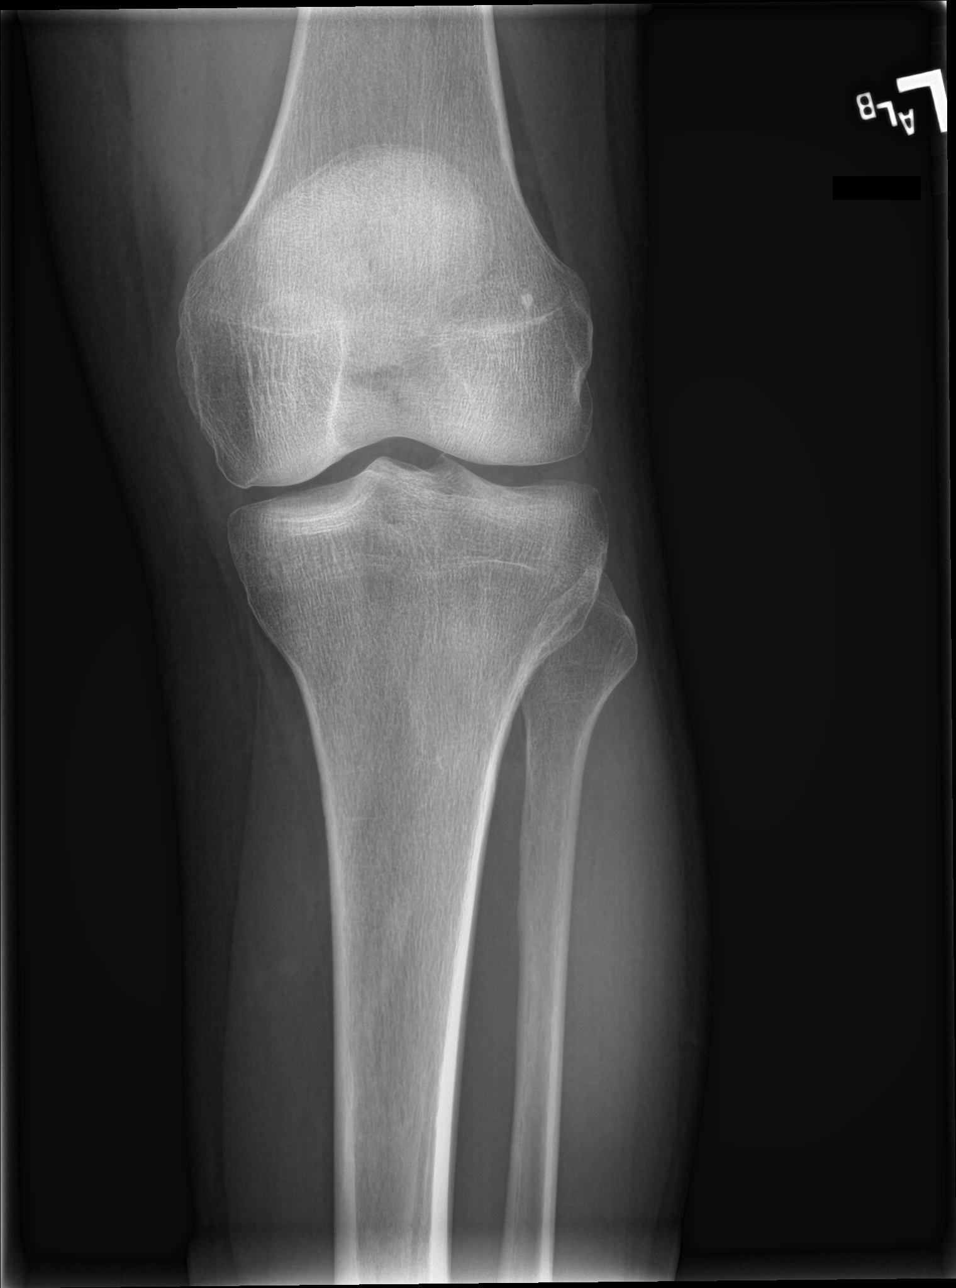

[knee lat]
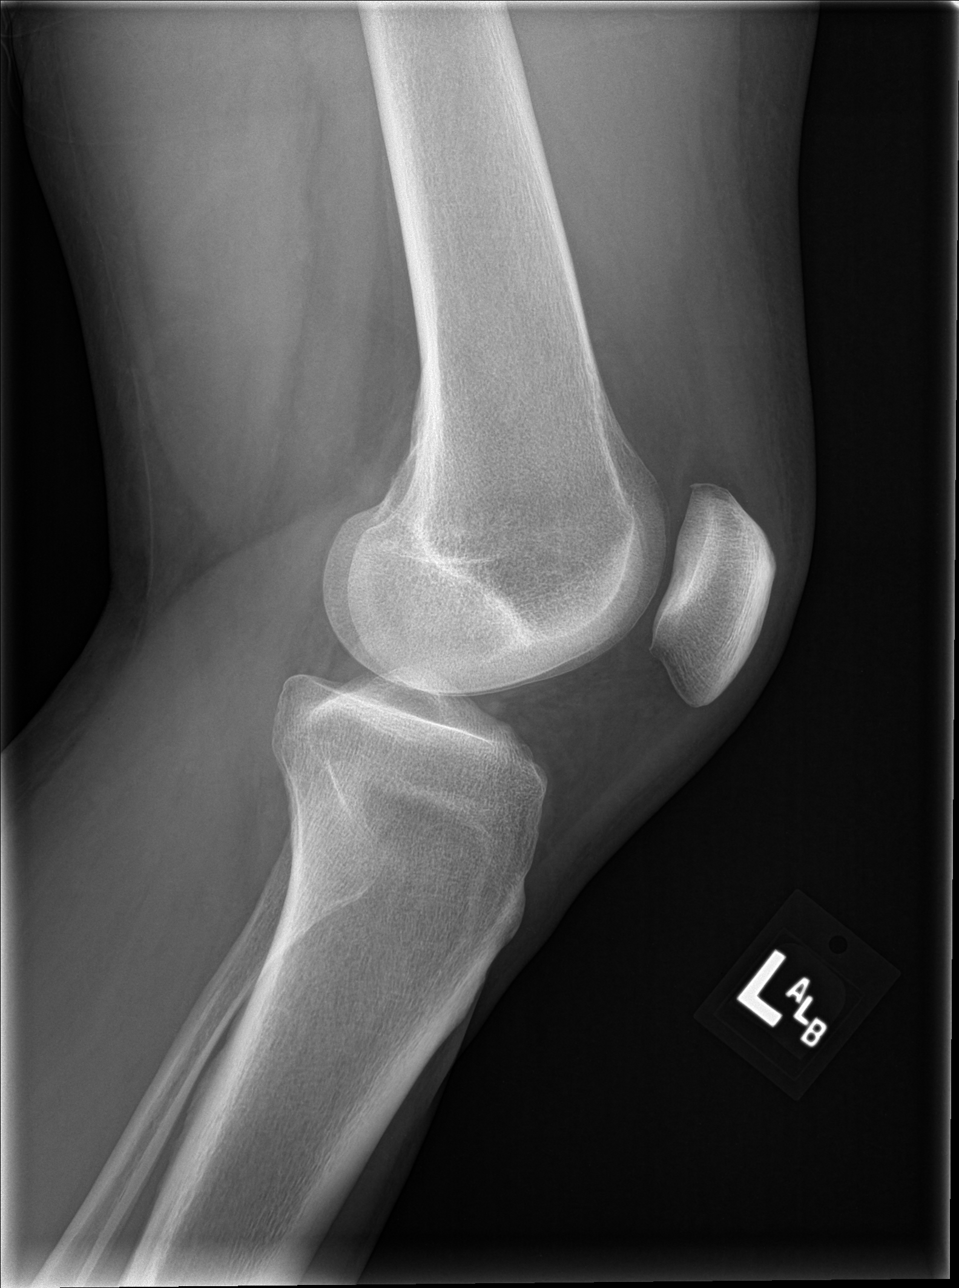

[tunnel]
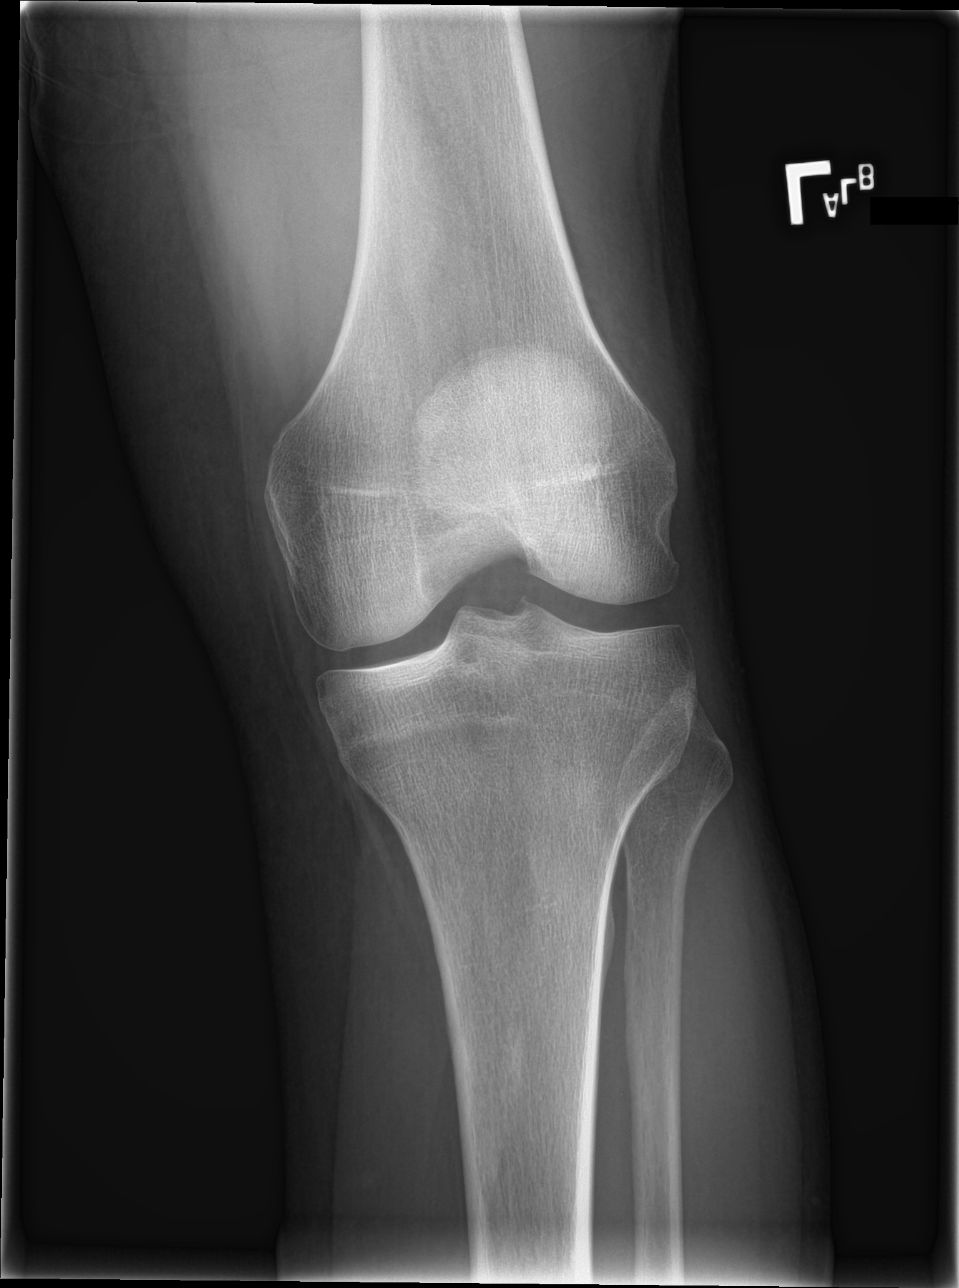

[patella skyline]
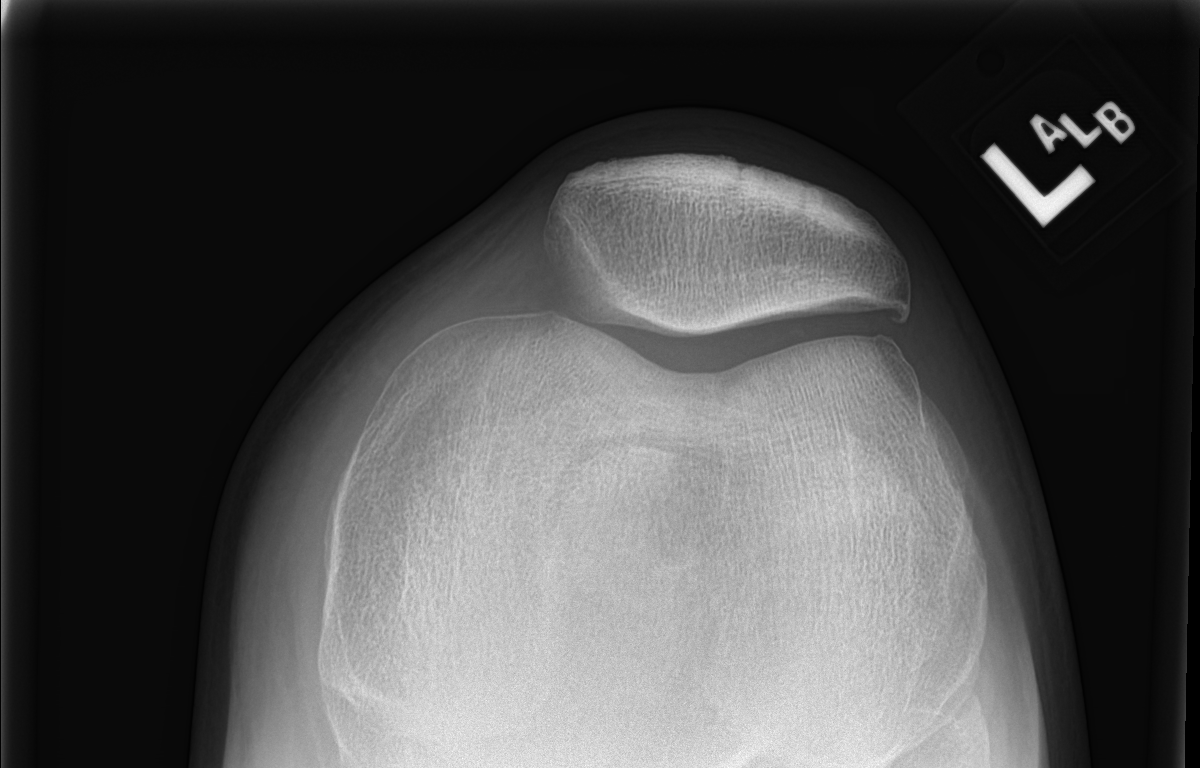

[4 of 4 positions shown; findings below may reference images not displayed]

FINDINGS: No evidence of fracture, dislocation, or joint effusion. Normal
alignment and joint spaces. Patella normally situated in the
trochlear groove. There is minimal patellofemoral spurring. Mild
soft tissue edema.
IMPRESSION: Mild soft tissue edema without acute fracture or subluxation.

## 2022-08-28 ENCOUNTER — Telehealth: Payer: Self-pay | Admitting: Family Medicine

## 2022-08-28 NOTE — Telephone Encounter (Signed)
Copied from CRM [id number]. Topic: General - Other >> Aug 28, 2022  8:46 AM Dondra Prader A wrote: Reason for CRM: Pt states that him and his wife with be traveling to [place] in a couple of weeks and seen that he is needing Hepatitis A and B and also typhoid vaccine. Pt is wanting to know if his PCP will be able to give him all three or if he need to go to the [facility] Dept. Please advise.

## 2022-08-28 NOTE — Telephone Encounter (Signed)
Pt is calling back stating that he did some research and found [facility] [place] [facility] the typhoid vaccine. They are needing the pt PCP to send over a prescription to them so they can give the pt the vaccine.  Pt is requesting a prescription to be sent for him and his wife Garrett Barnes. Pt states they are both pt's of Dr. Yetta Barre.  [facility] [address] [place], [place] [address] Hours:  Closes soon ? 1:30?PM ? Reopens 2?PM Phone: [phone]   Pt would like a call back to discuss.

## 2022-08-31 ENCOUNTER — Telehealth: Payer: Self-pay | Admitting: Family Medicine

## 2022-08-31 ENCOUNTER — Telehealth: Payer: Self-pay

## 2022-08-31 NOTE — Telephone Encounter (Signed)
Pt called in about typhoid vaccine, he says its called, [facility] and asking for it to be [facility] [place] [address] [place], [place] [address]. [phone] He says they were told they can get it there.

## 2022-08-31 NOTE — Telephone Encounter (Signed)
Called pt and explained that I had spoke to Pharmacy concerning the Typhoid Vax- they suggested it would be best to get through Health Dept due to it needing to be "very specific details"

## 2022-08-31 NOTE — Telephone Encounter (Signed)
Called pt unable to reach patient. Pts VM was not set up. We need to correct dosage that is needed.  KP

## 2022-08-31 NOTE — Telephone Encounter (Signed)
Copied from CRM [id number]. Topic: General - Other >> Aug 31, 2022 11:15 AM Everette C wrote: Reason for CRM: The patient has requested to speak directly with T. Lynch regarding this matter  The patient has called to request a prescription for a specific type of Typhoid vaccine (brand/manufacturer)   The patient has requested for their [facility] to [id number] [place] [place] - [address] AT [facility] [address] [place] [address] Phone: [phone] Fax: [phone] Hours: Not open 24 hours  The reason for the vaccination: travel, must also be stated on the prescription, per [facility]  Please contact further when possible

## 2022-09-01 ENCOUNTER — Encounter: Payer: Self-pay | Admitting: Family Medicine

## 2022-09-09 ENCOUNTER — Ambulatory Visit: Payer: BC Managed Care – PPO | Admitting: Family Medicine

## 2022-09-10 ENCOUNTER — Ambulatory Visit: Payer: BC Managed Care – PPO | Admitting: Family Medicine

## 2022-09-10 ENCOUNTER — Other Ambulatory Visit: Payer: Self-pay

## 2022-09-10 ENCOUNTER — Encounter: Payer: Self-pay | Admitting: Family Medicine

## 2022-09-10 ENCOUNTER — Telehealth: Payer: Self-pay | Admitting: Family Medicine

## 2022-09-10 VITALS — BP 120/78 | HR 84 | Ht 68.0 in | Wt 184.0 lb

## 2022-09-10 DIAGNOSIS — E782 Mixed hyperlipidemia: Secondary | ICD-10-CM

## 2022-09-10 DIAGNOSIS — H00015 Hordeolum externum left lower eyelid: Secondary | ICD-10-CM | POA: Diagnosis not present

## 2022-09-10 DIAGNOSIS — J4520 Mild intermittent asthma, uncomplicated: Secondary | ICD-10-CM

## 2022-09-10 DIAGNOSIS — F419 Anxiety disorder, unspecified: Secondary | ICD-10-CM

## 2022-09-10 DIAGNOSIS — K130 Diseases of lips: Secondary | ICD-10-CM

## 2022-09-10 DIAGNOSIS — F32A Depression, unspecified: Secondary | ICD-10-CM

## 2022-09-10 DIAGNOSIS — J358 Other chronic diseases of tonsils and adenoids: Secondary | ICD-10-CM

## 2022-09-10 DIAGNOSIS — H1033 Unspecified acute conjunctivitis, bilateral: Secondary | ICD-10-CM

## 2022-09-10 MED ORDER — SULFACETAMIDE SODIUM 10 % OP SOLN
1.0000 [drp] | OPHTHALMIC | 0 refills | Status: DC
Start: 2022-09-10 — End: 2022-11-09

## 2022-09-10 MED ORDER — MONTELUKAST SODIUM 10 MG PO TABS: 10.0000 mg | ORAL_TABLET | Freq: Every day | ORAL | 1 refills | Status: DC

## 2022-09-10 MED ORDER — VALACYCLOVIR HCL 500 MG PO TABS: 500.0000 mg | ORAL_TABLET | Freq: Two times a day (BID) | ORAL | 0 refills | Status: DC

## 2022-09-10 MED ORDER — SERTRALINE HCL 25 MG PO TABS: 12.5000 mg | ORAL_TABLET | Freq: Every day | ORAL | 1 refills | Status: DC

## 2022-09-10 MED ORDER — DOXYCYCLINE HYCLATE 100 MG PO TABS
100.0000 mg | ORAL_TABLET | Freq: Two times a day (BID) | ORAL | 0 refills | Status: DC
Start: 2022-09-10 — End: 2022-11-09

## 2022-09-10 MED ORDER — TOBRAMYCIN-DEXAMETHASONE 0.3-0.1 % OP OINT
1.0000 | TOPICAL_OINTMENT | OPHTHALMIC | 0 refills | Status: DC
Start: 2022-09-10 — End: 2022-09-10

## 2022-09-10 NOTE — Progress Notes (Signed)
Date:  09/10/2022   Name:  Garrett Barnes   DOB:  March 24, 1966   MRN:  [id number]   Chief Complaint: Allergic Rhinitis , Depression, viral syndrome, and Eye Problem (L) eye- came up Saturday)  Depression        This is a chronic problem.  The current episode started more than 1 year ago.   The onset quality is gradual.   The problem occurs intermittently.  The problem has been gradually improving since onset.  Associated symptoms include no decreased concentration, no fatigue, no helplessness, no hopelessness, does not have insomnia, not irritable, no restlessness, no decreased interest, no appetite change, no body aches, no myalgias, no headaches, no indigestion, not sad and no suicidal ideas.     The symptoms are aggravated by nothing.  Compliance with treatment is good.  Previous treatment provided moderate ("very helpful") relief. Eye Problem  The left eye is affected. This is a new problem. The current episode started in the past 7 days (Saturday). There was no injury mechanism. Pertinent negatives include no blurred vision, double vision, foreign body sensation or nausea.    Lab Results  Component Value Date   NA 140 11/05/2021   K 4.2 11/05/2021   CO2 22 11/05/2021   GLUCOSE 90 11/05/2021   BUN 13 11/05/2021   CREATININE 0.95 11/05/2021   CALCIUM 9.1 11/05/2021   EGFR 95 11/05/2021   GFRNONAA 95 08/02/2019   Lab Results  Component Value Date   CHOL 196 11/05/2021   HDL 48 11/05/2021   LDLCALC 130 (H) 11/05/2021   TRIG 98 11/05/2021   CHOLHDL 4.4 11/08/2017   Lab Results  Component Value Date   TSH 1.070 11/04/2020   No results found for: "HGBA1C" Lab Results  Component Value Date   WBC 6.9 11/04/2020   HGB 16.7 11/04/2020   HCT 48.3 11/04/2020   MCV 85 11/04/2020   PLT 172 11/04/2020   Lab Results  Component Value Date   ALT 30 11/05/2021   AST 22 11/05/2021   ALKPHOS 101 11/05/2021   BILITOT 1.6 (H) 11/05/2021   No results found for: "25OHVITD2",  "25OHVITD3", "VD25OH"   Review of Systems  Constitutional:  Negative for appetite change and fatigue.  Eyes:  Negative for blurred vision and double vision.  Respiratory:  Negative for cough, choking, chest tightness, shortness of breath and wheezing.   Cardiovascular:  Negative for chest pain, palpitations and leg swelling.  Gastrointestinal:  Negative for abdominal distention, constipation, diarrhea and nausea.  Musculoskeletal:  Negative for myalgias.  Neurological:  Negative for headaches.  Psychiatric/Behavioral:  Positive for depression. Negative for decreased concentration and suicidal ideas. The patient does not have insomnia.     Patient Active Problem List   Diagnosis Date Noted   History of colonic polyps    Diarrhea    Other male erectile dysfunction 06/23/2019   Incomplete emptying of bladder 02/03/2017   Family history of prostate cancer 02/02/2017   Benign localized hyperplasia of prostate with urinary obstruction 09/18/2013   Chronic prostatitis 09/18/2013   Increased frequency of urination 09/18/2013   Other specified disorder of male genital organs(608.89) 09/18/2013    Allergies  Allergen Reactions   Latex Hives   Penicillins Rash    Diarrhea    Past Surgical History:  Procedure Laterality Date   COLONOSCOPY WITH PROPOFOL N/A 11/10/2017   Procedure: COLONOSCOPY WITH PROPOFOL;  Surgeon: Toney Reil, MD;  Location[facility];  Service: Endoscopy;  Laterality: N/A;  COLONOSCOPY WITH PROPOFOL N/A 11/14/2020   Procedure: COLONOSCOPY WITH PROPOFOL;  Surgeon: Toney Reil, MD;  Location: [facility];  Service: Endoscopy;  Laterality: N/A;   PILONIDAL CYST EXCISION     WISDOM TOOTH EXTRACTION      Social History   Tobacco Use   Smoking status: Never   Smokeless tobacco: Never  Substance Use Topics   Alcohol use: No    Alcohol/week: 0.0 standard drinks of alcohol   Drug use: No     Medication list has been reviewed and  updated.  No outpatient medications have been marked as taking for the 09/10/22 encounter (Office Visit) with Duanne Limerick, MD.       09/10/2022    7:59 AM 04/27/2022    3:33 PM 03/17/2022   10:34 AM 02/04/2022   11:30 AM  GAD 7 : Generalized Anxiety Score  Nervous, Anxious, on Edge 0 0 0 0  Control/stop worrying 0 0 0 3  Worry too much - different things 0 0 0 2  Trouble relaxing 0 0 0 1  Restless 0 0 0 2  Easily annoyed or irritable 0 0 0 3  Afraid - awful might happen 0 0 0 1  Total GAD 7 Score 0 0 0 12  Anxiety Difficulty Not difficult at all Not difficult at all Not difficult at all Somewhat difficult       09/10/2022    7:58 AM 04/27/2022    3:33 PM 03/17/2022   10:34 AM  Depression screen PHQ 2/9  Decreased Interest 0 0 0  Down, Depressed, Hopeless 0 0 0  PHQ - 2 Score 0 0 0  Altered sleeping 0 0 0  Tired, decreased energy 0 0 0  Change in appetite 0 0 0  Feeling bad or failure about yourself  0 0 0  Trouble concentrating 0 0 0  Moving slowly or fidgety/restless 0 0 0  Suicidal thoughts 0 0 0  PHQ-9 Score 0 0 0  Difficult doing work/chores Not difficult at all Not difficult at all Not difficult at all    BP Readings from Last 3 Encounters:  09/10/22 120/78  04/27/22 110/74  03/17/22 120/76    Physical Exam Vitals and nursing note reviewed.  Constitutional:      General: He is not irritable. HENT:     Head: Normocephalic.     Right Ear: Tympanic membrane and external ear normal.     Left Ear: Tympanic membrane and external ear normal.     Nose: Nose normal.  Eyes:     General:        Left eye: Hordeolum present.    Conjunctiva/sclera: Conjunctivae normal.     Pupils: Pupils are equal, round, and reactive to light.  Neck:     Thyroid: No thyromegaly.     Vascular: No JVD.     Trachea: No tracheal deviation.  Cardiovascular:     Rate and Rhythm: Normal rate and regular rhythm.     Heart sounds: Normal heart sounds. No murmur heard.    No friction  rub. No gallop.  Pulmonary:     Effort: No respiratory distress.     Breath sounds: Normal breath sounds. No wheezing, rhonchi or rales.  Abdominal:     Palpations: Abdomen is soft.     Tenderness: There is no abdominal tenderness.  Musculoskeletal:        General: No tenderness. Normal range of motion.     Cervical back: Neck supple.  Lymphadenopathy:     Cervical: No cervical adenopathy.  Skin:    General: Skin is warm.     Findings: No rash.  Neurological:     Mental Status: He is alert.     Wt Readings from Last 3 Encounters:  09/10/22 184 lb (83.5 kg)  04/27/22 188 lb (85.3 kg)  03/17/22 182 lb (82.6 kg)    BP 120/78   Pulse 84   Ht 5\' 8"  (1.727 m)   Wt 184 lb (83.5 kg)   SpO2 96%   BMI 27.98 kg/m   Assessment and Plan:  1. Hordeolum externum left lower eyelid New onset.  Persistent.  Perhaps progressing.  Patient noted 5 days ago the onset of her lower lid stye which he has been using cool compresses.  I have suggested that he switch over to warm compresses and will use some tobramycin dexamethasone ophthalmic ointment every 4 hours along with doxycycline 100 mg twice a day for 5 days.  Patient has been suggested that if he has access to an optometrist to take a look to see if the possibility incision is necessary prior to going on a trip to [place]. - tobramycin-dexamethasone (TOBRADEX) ophthalmic ointment; Place 1 Application into the left eye every 4 (four) hours while awake.  Dispense: 3.5 g; Refill: 0 - doxycycline (VIBRA-TABS) 100 MG tablet; Take 1 tablet (100 mg total) by mouth 2 (two) times daily.  Dispense: 20 tablet; Refill: 0  2. Anxiety and depression Chronic.  Controlled.  Stable.  PHQ 0 GAD score 0.  Continue sertraline 12.5 mg once a day.  And will recheck in 6 months. - sertraline (ZOLOFT) 25 MG tablet; Take 0.5 tablets (12.5 mg total) by mouth daily.  Dispense: 45 tablet; Refill: 1  3. Mild intermittent reactive airway disease without  complication Chronic.  Controlled.  Stable.  Continue Singulair 10 mg once a day. - montelukast (SINGULAIR) 10 MG tablet; Take 1 tablet (10 mg total) by mouth at bedtime.  Dispense: 90 tablet; Refill: 1  4. Cheilitis Recurrent infections with needed Valtrex on a as needed basis. - valACYclovir (VALTREX) 500 MG tablet; Take 1 tablet (500 mg total) by mouth 2 (two) times daily.  Dispense: 14 tablet; Refill: 0  5. Tonsillar exudate Currently stable but is taken montelukast on an as-needed basis.  6. Mixed hyperlipidemia Chronic.  Controlled.  Stable.  Continue with dietary control will check lipid panel and CMP for hepatic toxicity/early steatosis. - Comprehensive Metabolic Panel (CMET) - Lipid Panel With LDL/HDL Ratio    Elizabeth Sauer, MD

## 2022-09-10 NOTE — Telephone Encounter (Signed)
Drew from [employer] stated that tobramycin-dexamethasone [facility]) ophthalmic ointment is currently unavailable, asking if they can fill an alternative suspension instead of ointment.  Garrett Barnes is requesting a callback.  Please advise.

## 2022-09-21 ENCOUNTER — Telehealth: Payer: Self-pay

## 2022-09-21 NOTE — Telephone Encounter (Signed)
Called pt and left message to go get labs drawn

## 2022-11-04 ENCOUNTER — Encounter: Payer: Self-pay | Admitting: Family Medicine

## 2022-11-09 ENCOUNTER — Encounter: Payer: Self-pay | Admitting: Family Medicine

## 2022-11-09 ENCOUNTER — Ambulatory Visit (INDEPENDENT_AMBULATORY_CARE_PROVIDER_SITE_OTHER): Payer: BC Managed Care – PPO | Admitting: Family Medicine

## 2022-11-09 VITALS — BP 120/64 | HR 80 | Ht 68.0 in | Wt 188.0 lb

## 2022-11-09 DIAGNOSIS — Z23 Encounter for immunization: Secondary | ICD-10-CM

## 2022-11-09 DIAGNOSIS — Z0000 Encounter for general adult medical examination without abnormal findings: Secondary | ICD-10-CM

## 2022-11-09 NOTE — Progress Notes (Addendum)
Date:  11/09/2022   Name:  Garrett Barnes   DOB:  [birth year]   MRN:  [id number]   Chief Complaint: Annual Exam  Patient is a 57 year old male who presents for a comprehensive physical exam. The patient reports the following problems: none. Health maintenance has been reviewed up to date.      Lab Results  Component Value Date   NA 140 11/05/2021   K 4.2 11/05/2021   CO2 22 11/05/2021   GLUCOSE 90 11/05/2021   BUN 13 11/05/2021   CREATININE 0.95 11/05/2021   CALCIUM 9.1 11/05/2021   EGFR 95 11/05/2021   GFRNONAA 95 08/02/2019   Lab Results  Component Value Date   CHOL 196 11/05/2021   HDL 48 11/05/2021   LDLCALC 130 (H) 11/05/2021   TRIG 98 11/05/2021   CHOLHDL 4.4 11/08/2017   Lab Results  Component Value Date   TSH 1.070 11/04/2020   No results found for: "HGBA1C" Lab Results  Component Value Date   WBC 6.9 11/04/2020   HGB 16.7 11/04/2020   HCT 48.3 11/04/2020   MCV 85 11/04/2020   PLT 172 11/04/2020   Lab Results  Component Value Date   ALT 30 11/05/2021   AST 22 11/05/2021   ALKPHOS 101 11/05/2021   BILITOT 1.6 (H) 11/05/2021   No results found for: "25OHVITD2", "25OHVITD3", "VD25OH"   Review of Systems  Constitutional:  Negative for chills, fatigue and fever.  HENT:  Negative for drooling, ear discharge, ear pain and sore throat.   Respiratory:  Negative for cough, shortness of breath and wheezing.   Cardiovascular:  Negative for chest pain, palpitations and leg swelling.  Gastrointestinal:  Negative for abdominal pain, blood in stool, constipation, diarrhea and nausea.  Endocrine: Negative for polydipsia.  Genitourinary:  Negative for dysuria, frequency, hematuria and urgency.  Musculoskeletal:  Negative for back pain, myalgias and neck pain.  Skin:  Negative for rash.  Allergic/Immunologic: Negative for environmental allergies.  Neurological:  Negative for dizziness and headaches.  Hematological:  Does not bruise/bleed easily.   Psychiatric/Behavioral:  Negative for suicidal ideas. The patient is not nervous/anxious.     Patient Active Problem List   Diagnosis Date Noted   History of colonic polyps    Diarrhea    Other male erectile dysfunction 06/23/2019   Incomplete emptying of bladder 02/03/2017   Family history of prostate cancer 02/02/2017   Benign localized hyperplasia of prostate with urinary obstruction 09/18/2013   Chronic prostatitis 09/18/2013   Increased frequency of urination 09/18/2013   Other specified disorder of male genital organs(608.89) 09/18/2013    Allergies  Allergen Reactions   Latex Hives   Penicillins Rash    Diarrhea    Past Surgical History:  Procedure Laterality Date   COLONOSCOPY WITH PROPOFOL N/A 11/10/2017   Procedure: COLONOSCOPY WITH PROPOFOL;  Surgeon: Toney Reil, MD;  Location: [facility];  Service: Endoscopy;  Laterality: N/A;   COLONOSCOPY WITH PROPOFOL N/A 11/14/2020   Procedure: COLONOSCOPY WITH PROPOFOL;  Surgeon: Toney Reil, MD;  Location: [facility];  Service: Endoscopy;  Laterality: N/A;   PILONIDAL CYST EXCISION     WISDOM TOOTH EXTRACTION      Social History   Tobacco Use   Smoking status: Never   Smokeless tobacco: Never  Substance Use Topics   Alcohol use: No    Alcohol/week: 0.0 standard drinks of alcohol   Drug use: No     Medication list has been reviewed  and updated.  Current Meds  Medication Sig   montelukast (SINGULAIR) 10 MG tablet Take 1 tablet (10 mg total) by mouth at bedtime.   Multiple Vitamin (MULTIVITAMIN) capsule Take 1 capsule by mouth daily.   sertraline (ZOLOFT) 25 MG tablet Take 0.5 tablets (12.5 mg total) by mouth daily.   tadalafil (CIALIS) 5 MG tablet Take 5 mg by mouth.   triamcinolone (KENALOG) 0.1 % paste USE AS DIRECTED IN THE MOUTHOR THROAT DAILY   valACYclovir (VALTREX) 500 MG tablet Take 1 tablet (500 mg total) by mouth 2 (two) times daily.       11/09/2022    9:28 AM  09/10/2022    7:59 AM 04/27/2022    3:33 PM 03/17/2022   10:34 AM  GAD 7 : Generalized Anxiety Score  Nervous, Anxious, on Edge 0 0 0 0  Control/stop worrying 0 0 0 0  Worry too much - different things 0 0 0 0  Trouble relaxing 0 0 0 0  Restless 0 0 0 0  Easily annoyed or irritable 0 0 0 0  Afraid - awful might happen 0 0 0 0  Total GAD 7 Score 0 0 0 0  Anxiety Difficulty Not difficult at all Not difficult at all Not difficult at all Not difficult at all       11/09/2022    9:28 AM 09/10/2022    7:58 AM 04/27/2022    3:33 PM  Depression screen PHQ 2/9  Decreased Interest 0 0 0  Down, Depressed, Hopeless 0 0 0  PHQ - 2 Score 0 0 0  Altered sleeping 0 0 0  Tired, decreased energy 0 0 0  Change in appetite 0 0 0  Feeling bad or failure about yourself  0 0 0  Trouble concentrating 0 0 0  Moving slowly or fidgety/restless 0 0 0  Suicidal thoughts 0 0 0  PHQ-9 Score 0 0 0  Difficult doing work/chores Not difficult at all Not difficult at all Not difficult at all    BP Readings from Last 3 Encounters:  11/09/22 120/64  09/10/22 120/78  04/27/22 110/74    Physical Exam Vitals and nursing note reviewed.  Constitutional:      Appearance: He is well-groomed and overweight.  HENT:     Head: Normocephalic.     Jaw: There is normal jaw occlusion.     Right Ear: Hearing, tympanic membrane, ear canal and external ear normal. No decreased hearing noted.     Left Ear: Hearing, tympanic membrane, ear canal and external ear normal.     Nose: Nose normal. No congestion or rhinorrhea.     Mouth/Throat:     Mouth: Mucous membranes are moist.  Eyes:     General: No scleral icterus.       Right eye: No discharge.        Left eye: No discharge.     Conjunctiva/sclera: Conjunctivae normal.     Pupils: Pupils are equal, round, and reactive to light.  Neck:     Thyroid: No thyromegaly.     Vascular: No JVD.     Trachea: No tracheal deviation.  Cardiovascular:     Rate and Rhythm:  Normal rate and regular rhythm.     Chest Wall: PMI is not displaced.     Pulses:          Carotid pulses are 2+ on the right side and 2+ on the left side.      Radial pulses are  2+ on the right side and 2+ on the left side.       Femoral pulses are 2+ on the right side and 2+ on the left side.      Popliteal pulses are 2+ on the right side and 2+ on the left side.       Dorsalis pedis pulses are 2+ on the right side and 2+ on the left side.       Posterior tibial pulses are 2+ on the right side and 2+ on the left side.     Heart sounds: Normal heart sounds, S1 normal and S2 normal. No murmur heard.    No systolic murmur is present.     No diastolic murmur is present.     No friction rub. No gallop. No S3 or S4 sounds.  Pulmonary:     Effort: No respiratory distress.     Breath sounds: Normal breath sounds. No decreased breath sounds, wheezing, rhonchi or rales.  Abdominal:     General: Bowel sounds are normal.     Palpations: Abdomen is soft. There is no mass.     Tenderness: There is no abdominal tenderness. There is no guarding or rebound.  Genitourinary:    Penis: Normal and circumcised.      Testes: Normal.        Right: Mass not present.        Left: Mass not present.     Epididymis:     Right: Normal.     Prostate: Normal. Not enlarged, not tender and no nodules present.     Rectum: Normal. Guaiac result negative. No mass.  Musculoskeletal:        General: No tenderness. Normal range of motion.     Cervical back: Normal range of motion and neck supple.     Right lower leg: No edema.     Left lower leg: No edema.  Lymphadenopathy:     Cervical: No cervical adenopathy.     Right cervical: No superficial, deep or posterior cervical adenopathy.    Left cervical: No superficial or deep cervical adenopathy.     Upper Body:     Right upper body: No supraclavicular or axillary adenopathy.     Left upper body: No supraclavicular or axillary adenopathy.  Skin:    General: Skin  is warm.     Capillary Refill: Capillary refill takes less than 2 seconds.     Findings: No rash.  Neurological:     Mental Status: He is alert and oriented to person, place, and time.     Cranial Nerves: No cranial nerve deficit.     Deep Tendon Reflexes: Reflexes are normal and symmetric.     Reflex Scores:      Bicep reflexes are 2+ on the right side and 2+ on the left side.      Brachioradialis reflexes are 2+ on the right side and 2+ on the left side.      Patellar reflexes are 2+ on the right side and 2+ on the left side. Psychiatric:        Behavior: Behavior is cooperative.     Wt Readings from Last 3 Encounters:  11/09/22 188 lb (85.3 kg)  09/10/22 184 lb (83.5 kg)  04/27/22 188 lb (85.3 kg)    BP 120/64   Pulse 80   Ht 5\' 8"  (1.727 m)   Wt 188 lb (85.3 kg)   SpO2 98%   BMI 28.59 kg/m   Assessment and  Plan: Garrett Barnes is a 57 y.o. male who presents today for his Complete Annual Exam. He feels fairly well. He reports exercising . He reports he is sleeping fairly well.  Immunizations are reviewed and recommendations provided.   Age appropriate screening tests are discussed. Counseling given for risk factor reduction interventions.  1. Annual physical exam No subjective/objective concerns noted during HPI, review of systems, or physical exam.  Will check lipid panel comprehensive metabolic panel PSA and thyroid panel. - Lipid Panel With LDL/HDL Ratio - Comprehensive Metabolic Panel (CMET) - PSA - Thyroid Panel With TSH    Elizabeth Sauer, MD

## 2022-11-10 ENCOUNTER — Encounter: Payer: Self-pay | Admitting: Family Medicine

## 2022-12-15 ENCOUNTER — Other Ambulatory Visit: Payer: Self-pay | Admitting: Family Medicine

## 2022-12-15 DIAGNOSIS — F32A Depression, unspecified: Secondary | ICD-10-CM

## 2023-01-14 ENCOUNTER — Encounter: Payer: Self-pay | Admitting: Family Medicine

## 2023-02-09 ENCOUNTER — Ambulatory Visit: Payer: BC Managed Care – PPO

## 2023-03-12 ENCOUNTER — Ambulatory Visit: Payer: BC Managed Care – PPO | Admitting: Family Medicine

## 2023-06-07 ENCOUNTER — Other Ambulatory Visit: Payer: Self-pay

## 2023-06-07 DIAGNOSIS — F32A Depression, unspecified: Secondary | ICD-10-CM

## 2023-06-07 DIAGNOSIS — J4520 Mild intermittent asthma, uncomplicated: Secondary | ICD-10-CM

## 2023-06-07 MED ORDER — MONTELUKAST SODIUM 10 MG PO TABS
10.0000 mg | ORAL_TABLET | Freq: Every day | ORAL | 0 refills | Status: DC
Start: 2023-06-07 — End: 2023-07-20

## 2023-06-07 MED ORDER — SERTRALINE HCL 25 MG PO TABS
12.5000 mg | ORAL_TABLET | Freq: Every day | ORAL | 0 refills | Status: DC
Start: 2023-06-07 — End: 2023-07-20

## 2023-07-20 ENCOUNTER — Ambulatory Visit: Admitting: Family Medicine

## 2023-07-20 ENCOUNTER — Encounter: Payer: Self-pay | Admitting: Family Medicine

## 2023-07-20 VITALS — BP 118/80 | HR 92 | Ht 68.0 in | Wt 182.0 lb

## 2023-07-20 DIAGNOSIS — F32A Depression, unspecified: Secondary | ICD-10-CM

## 2023-07-20 DIAGNOSIS — F419 Anxiety disorder, unspecified: Secondary | ICD-10-CM | POA: Diagnosis not present

## 2023-07-20 DIAGNOSIS — J4520 Mild intermittent asthma, uncomplicated: Secondary | ICD-10-CM

## 2023-07-20 DIAGNOSIS — R051 Acute cough: Secondary | ICD-10-CM | POA: Diagnosis not present

## 2023-07-20 DIAGNOSIS — J019 Acute sinusitis, unspecified: Secondary | ICD-10-CM | POA: Diagnosis not present

## 2023-07-20 MED ORDER — SERTRALINE HCL 25 MG PO TABS
12.5000 mg | ORAL_TABLET | Freq: Every day | ORAL | 1 refills | Status: DC
Start: 2023-07-20 — End: 2023-11-11

## 2023-07-20 MED ORDER — SERTRALINE HCL 25 MG PO TABS
12.5000 mg | ORAL_TABLET | Freq: Every day | ORAL | 0 refills | Status: DC
Start: 2023-07-20 — End: 2023-07-20

## 2023-07-20 MED ORDER — MONTELUKAST SODIUM 10 MG PO TABS
10.0000 mg | ORAL_TABLET | Freq: Every day | ORAL | 11 refills | Status: DC
Start: 2023-07-20 — End: 2023-08-25

## 2023-07-20 MED ORDER — BENZONATATE 100 MG PO CAPS
100.0000 mg | ORAL_CAPSULE | Freq: Two times a day (BID) | ORAL | 0 refills | Status: DC | PRN
Start: 2023-07-20 — End: 2023-09-16

## 2023-07-20 MED ORDER — AZITHROMYCIN 250 MG PO TABS
ORAL_TABLET | ORAL | 0 refills | Status: AC
Start: 2023-07-20 — End: 2023-07-25

## 2023-07-20 MED ORDER — MONTELUKAST SODIUM 10 MG PO TABS
10.0000 mg | ORAL_TABLET | Freq: Every day | ORAL | 0 refills | Status: DC
Start: 2023-07-20 — End: 2023-07-20

## 2023-07-20 NOTE — Progress Notes (Signed)
 Date:  07/20/2023   Name:  Garrett Barnes   DOB:  1965-07-02   MRN:  [id number]   Chief Complaint: Cough (Cough started today. Patient having congestion and post nasal drip since 07/16/23. He is also starting to feel his right ear feeling clogged. )  Sinusitis This is a new problem. The current episode started in the past 7 days. The problem has been gradually worsening since onset. There has been no fever. The pain is mild. Associated symptoms include congestion, coughing, ear pain, headaches, sinus pressure and a sore throat. Pertinent negatives include no chills, diaphoresis, shortness of breath, sneezing or swollen glands. Treatments tried: antihistamine. The treatment provided no relief.    Lab Results  Component Value Date   NA 138 11/11/2022   K 4.0 11/11/2022   CO2 22 11/11/2022   GLUCOSE 97 11/11/2022   BUN 8 11/11/2022   CREATININE 1.03 11/11/2022   CALCIUM 8.8 11/11/2022   EGFR 86 11/11/2022   GFRNONAA 95 08/02/2019   Lab Results  Component Value Date   CHOL 177 11/11/2022   HDL 49 11/11/2022   LDLCALC 100 (H) 11/11/2022   TRIG 159 (H) 11/11/2022   CHOLHDL 4.4 11/08/2017   Lab Results  Component Value Date   TSH 1.030 11/11/2022   No results found for: "HGBA1C" Lab Results  Component Value Date   WBC 6.9 11/04/2020   HGB 16.7 11/04/2020   HCT 48.3 11/04/2020   MCV 85 11/04/2020   PLT 172 11/04/2020   Lab Results  Component Value Date   ALT 25 11/11/2022   AST 23 11/11/2022   ALKPHOS 102 11/11/2022   BILITOT 1.5 (H) 11/11/2022   No results found for: "25OHVITD2", "25OHVITD3", "VD25OH"   Review of Systems  Constitutional:  Negative for chills, diaphoresis and fever.  HENT:  Positive for congestion, ear pain, sinus pressure and sore throat. Negative for hearing loss, mouth sores, sinus pain and sneezing.   Respiratory:  Positive for cough. Negative for chest tightness, shortness of breath, wheezing and stridor.   Cardiovascular:  Negative for chest  pain, palpitations and leg swelling.  Neurological:  Positive for headaches.    Patient Active Problem List   Diagnosis Date Noted   History of colonic polyps    Diarrhea    Other male erectile dysfunction 06/23/2019   Incomplete emptying of bladder 02/03/2017   Family history of prostate cancer 02/02/2017   Benign localized hyperplasia of prostate with urinary obstruction 09/18/2013   Chronic prostatitis 09/18/2013   Increased frequency of urination 09/18/2013   Other specified disorder of male genital organs(608.89) 09/18/2013    Allergies  Allergen Reactions   Latex Hives   Penicillins Rash    Diarrhea    Past Surgical History:  Procedure Laterality Date   COLONOSCOPY WITH PROPOFOL  N/A 11/10/2017   Procedure: COLONOSCOPY WITH PROPOFOL ;  Surgeon: Selena Daily, MD;  Location: [facility];  Service: Endoscopy;  Laterality: N/A;   COLONOSCOPY WITH PROPOFOL  N/A 11/14/2020   Procedure: COLONOSCOPY WITH PROPOFOL ;  Surgeon: Selena Daily, MD;  Location: [facility];  Service: Endoscopy;  Laterality: N/A;   PILONIDAL CYST EXCISION     WISDOM TOOTH EXTRACTION      Social History   Tobacco Use   Smoking status: Never   Smokeless tobacco: Never  Substance Use Topics   Alcohol use: No    Alcohol/week: 0.0 standard drinks of alcohol   Drug use: No     Medication list has been  reviewed and updated.  Current Meds  Medication Sig   montelukast  (SINGULAIR ) 10 MG tablet Take 1 tablet (10 mg total) by mouth at bedtime.   Multiple Vitamin (MULTIVITAMIN) capsule Take 1 capsule by mouth daily.   sertraline  (ZOLOFT ) 25 MG tablet Take 0.5 tablets (12.5 mg total) by mouth daily.   tadalafil (CIALIS) 5 MG tablet Take 5 mg by mouth.   triamcinolone  (KENALOG ) 0.1 % paste USE AS DIRECTED IN THE MOUTHOR THROAT DAILY   valACYclovir  (VALTREX ) 500 MG tablet Take 1 tablet (500 mg total) by mouth 2 (two) times daily.       07/20/2023    1:19 PM 11/09/2022    9:28  AM 09/10/2022    7:59 AM 04/27/2022    3:33 PM  GAD 7 : Generalized Anxiety Score  Nervous, Anxious, on Edge 1 0 0 0  Control/stop worrying 0 0 0 0  Worry too much - different things 1 0 0 0  Trouble relaxing 1 0 0 0  Restless 1 0 0 0  Easily annoyed or irritable 1 0 0 0  Afraid - awful might happen 1 0 0 0  Total GAD 7 Score 6 0 0 0  Anxiety Difficulty Somewhat difficult Not difficult at all Not difficult at all Not difficult at all       07/20/2023    1:19 PM 11/09/2022    9:28 AM 09/10/2022    7:58 AM  Depression screen PHQ 2/9  Decreased Interest 0 0 0  Down, Depressed, Hopeless 0 0 0  PHQ - 2 Score 0 0 0  Altered sleeping 2 0 0  Tired, decreased energy 2 0 0  Change in appetite 0 0 0  Feeling bad or failure about yourself  0 0 0  Trouble concentrating 2 0 0  Moving slowly or fidgety/restless 2 0 0  Suicidal thoughts 0 0 0  PHQ-9 Score 8 0 0  Difficult doing work/chores Not difficult at all Not difficult at all Not difficult at all    BP Readings from Last 3 Encounters:  07/20/23 118/80  11/09/22 120/64  09/10/22 120/78    Physical Exam Vitals and nursing note reviewed.  Constitutional:      Appearance: He is well-developed.  HENT:     Head: Normocephalic and atraumatic.     Right Ear: Tympanic membrane, ear canal and external ear normal.     Left Ear: Tympanic membrane, ear canal and external ear normal.     Nose: Nose normal.     Mouth/Throat:     Dentition: Normal dentition.  Eyes:     General: Lids are normal. No scleral icterus.    Conjunctiva/sclera: Conjunctivae normal.     Pupils: Pupils are equal, round, and reactive to light.  Neck:     Thyroid : No thyromegaly.     Vascular: No carotid bruit, hepatojugular reflux or JVD.     Trachea: No tracheal deviation.  Cardiovascular:     Rate and Rhythm: Normal rate and regular rhythm.     Heart sounds: Normal heart sounds.  Pulmonary:     Effort: Pulmonary effort is normal.     Breath sounds: Normal  breath sounds.  Abdominal:     General: Bowel sounds are normal.     Palpations: Abdomen is soft. There is no hepatomegaly, splenomegaly or mass.     Tenderness: There is no abdominal tenderness.     Hernia: There is no hernia in the left inguinal area.  Musculoskeletal:  General: Normal range of motion.     Cervical back: Normal range of motion and neck supple.  Lymphadenopathy:     Cervical: No cervical adenopathy.  Skin:    General: Skin is warm and dry.     Findings: No rash.  Neurological:     Mental Status: He is alert and oriented to person, place, and time.     Sensory: No sensory deficit.     Deep Tendon Reflexes: Reflexes are normal and symmetric.  Psychiatric:        Mood and Affect: Mood is not anxious or depressed.     Wt Readings from Last 3 Encounters:  07/20/23 182 lb (82.6 kg)  11/09/22 188 lb (85.3 kg)  09/10/22 184 lb (83.5 kg)    BP 118/80   Pulse 92   Ht 5\' 8"  (1.727 m)   Wt 182 lb (82.6 kg)   SpO2 98%   BMI 27.67 kg/m   Assessment and Plan:  1. Mild intermittent reactive airway disease without complication Chronic.  Intermittent.  Stable.  Will refill patient's medication for his asthma which includes montelukast  10 mg once a day. - montelukast  (SINGULAIR ) 10 MG tablet; Take 1 tablet (10 mg total) by mouth at bedtime.  Dispense: 30 tablet; Refill: 11  2. Anxiety and depression Chronic.  Controlled.  Stable.  PHQ is 8 GAD score is 6.  Continue sertraline  25 mg 1/2 tablet which is 12-1/2 mg total daily and this is just enough to settle his anxiety and to improve his depressive symptomatology. - sertraline  (ZOLOFT ) 25 MG tablet; Take 0.5 tablets (12.5 mg total) by mouth daily.  Dispense: 45 tablet; Refill: 1  3. Acute non-recurrent sinusitis, unspecified location (Primary) Acute.  Episodic.  Persistent at this time.  Patient has sinus which is around his time and even though this may be allergy it is also got some characteristics for sinusitis  and we will treat with azithromycin  to 50 mg 2 today followed by 1 a day for 4 days. - azithromycin  (ZITHROMAX ) 250 MG tablet; Take 2 tablets on day 1, then 1 tablet daily on days 2 through 5  Dispense: 6 tablet; Refill: 0  4. Acute cough The patient has an acute cough which I have suggested getting a mucolytic agent with a cough suppressant such as Mucinex  DM and patient will proceed with that but will have some Tessalon  Perles to take on an episodic basis when he cannot be in a position that he has any coughing. - benzonatate  (TESSALON ) 100 MG capsule; Take 1 capsule (100 mg total) by mouth 2 (two) times daily as needed for cough.  Dispense: 20 capsule; Refill: 0    Alayne Allis, MD

## 2023-08-24 ENCOUNTER — Other Ambulatory Visit: Payer: Self-pay | Admitting: Family Medicine

## 2023-08-24 DIAGNOSIS — J4520 Mild intermittent asthma, uncomplicated: Secondary | ICD-10-CM

## 2023-09-16 ENCOUNTER — Ambulatory Visit: Payer: Self-pay

## 2023-09-16 ENCOUNTER — Encounter: Payer: Self-pay | Admitting: Student

## 2023-09-16 ENCOUNTER — Ambulatory Visit: Admitting: Student

## 2023-09-16 VITALS — BP 128/82 | HR 90 | Temp 99.6°F | Ht 68.0 in | Wt 183.2 lb

## 2023-09-16 DIAGNOSIS — J0100 Acute maxillary sinusitis, unspecified: Secondary | ICD-10-CM

## 2023-09-16 MED ORDER — AZITHROMYCIN 250 MG PO TABS: ORAL_TABLET | ORAL | 0 refills | Status: AC

## 2023-09-16 NOTE — Progress Notes (Signed)
 Established Patient Office Visit  Subjective   Patient ID: Garrett Barnes, male    DOB: 22-Sep-1965  Age: 58 y.o. MRN: [id number]  Chief Complaint  Patient presents with   Cough    Since Saturday, last night was worse, has tired OTC medication, sweating at night   Nasal Congestion   Cough 5 days Saturday noticed a stretchy throat and started having a dry cough Sunday. Now having burning behind the nose last night with rhinorrhea and sinus congestion and chills at night. Denies fever over 100.4,shortness of breath, nausea, vomiting, sore throat, or wheezing. Taking tylenol and guaifenesin , pseudoephedrine without much improvement. Has not used antihistamines which causes constipation.   Patient Active Problem List   Diagnosis Date Noted   History of colonic polyps    Diarrhea    Other male erectile dysfunction 06/23/2019   Incomplete emptying of bladder 02/03/2017   Family history of prostate cancer 02/02/2017   Benign localized hyperplasia of prostate with urinary obstruction 09/18/2013   Chronic prostatitis 09/18/2013   Increased frequency of urination 09/18/2013   Other specified disorder of male genital organs(608.89) 09/18/2013      ROS Refer to HPI    Objective:     BP 128/82   Pulse 90   Temp 99.6 F (37.6 C) (Oral)   Ht 5' 8 (1.727 m)   Wt 183 lb 4 oz (83.1 kg)   SpO2 96%   BMI 27.86 kg/m  BP Readings from Last 3 Encounters:  09/16/23 128/82  07/20/23 118/80  11/09/22 120/64    Physical Exam Constitutional:      Appearance: Normal appearance.  HENT:     Right Ear: Tympanic membrane normal.     Left Ear: Tympanic membrane normal.     Nose: Congestion and rhinorrhea present. Rhinorrhea is clear.     Right Turbinates: Enlarged and swollen.     Left Turbinates: Enlarged and swollen.     Right Sinus: Maxillary sinus tenderness present.     Left Sinus: Maxillary sinus tenderness present.     Mouth/Throat:     Pharynx: Posterior oropharyngeal erythema  present. No oropharyngeal exudate.   Cardiovascular:     Rate and Rhythm: Normal rate and regular rhythm.  Pulmonary:     Effort: Pulmonary effort is normal.     Breath sounds: No rhonchi or rales.  Abdominal:     General: Abdomen is flat. Bowel sounds are normal. There is no distension.     Palpations: Abdomen is soft.     Tenderness: There is no abdominal tenderness.   Musculoskeletal:        General: Normal range of motion.     Right lower leg: No edema.     Left lower leg: No edema.  Lymphadenopathy:     Cervical: No cervical adenopathy.   Skin:    General: Skin is warm and dry.     Capillary Refill: Capillary refill takes less than 2 seconds.   Neurological:     General: No focal deficit present.     Mental Status: He is alert and oriented to person, place, and time.   Psychiatric:        Mood and Affect: Mood normal.        Behavior: Behavior normal.        07/20/2023    1:19 PM 11/09/2022    9:28 AM 09/10/2022    7:58 AM  Depression screen PHQ 2/9  Decreased Interest 0 0 0  Down, Depressed, Hopeless  0 0 0  PHQ - 2 Score 0 0 0  Altered sleeping 2 0 0  Tired, decreased energy 2 0 0  Change in appetite 0 0 0  Feeling bad or failure about yourself  0 0 0  Trouble concentrating 2 0 0  Moving slowly or fidgety/restless 2 0 0  Suicidal thoughts 0 0 0  PHQ-9 Score 8 0 0  Difficult doing work/chores Not difficult at all Not difficult at all Not difficult at all       07/20/2023    1:19 PM 11/09/2022    9:28 AM 09/10/2022    7:59 AM 04/27/2022    3:33 PM  GAD 7 : Generalized Anxiety Score  Nervous, Anxious, on Edge 1 0 0 0  Control/stop worrying 0 0 0 0  Worry too much - different things 1 0 0 0  Trouble relaxing 1 0 0 0  Restless 1 0 0 0  Easily annoyed or irritable 1 0 0 0  Afraid - awful might happen 1 0 0 0  Total GAD 7 Score 6 0 0 0  Anxiety Difficulty Somewhat difficult Not difficult at all Not difficult at all Not difficult at all    No results found  for any visits on 09/16/23.    The 10-year ASCVD risk score (Arnett DK, et al., 2019) is: 5%    Assessment & Plan:  Acute non-recurrent maxillary sinusitis Elevated temperature on exam today, is taking tylenol. Lung exam is reassuring. Likely sinusitis given elevated temperature today will send in antibiotics, he does have a penicillin allergy. Azithromycin  has worked well for him in the past. Continue tylenol and NSAIDs as needed, Guaifenesin  for congestion, nasal corticosteroids, and nasal irrigation for symptoms.  Other orders -     Azithromycin ; Take 2 tablets on day 1, then 1 tablet daily on days 2 through 5  Dispense: 6 tablet; Refill: 0     Return in about 2 weeks (around 09/30/2023), or if symptoms worsen or fail to improve.    Barnetta Liberty, MD

## 2023-09-16 NOTE — Patient Instructions (Addendum)
 Please Flonase or Nasacort , please gargle after using the spray to prevent thrust  Please use nasal irrigation

## 2023-09-16 NOTE — Telephone Encounter (Signed)
 FYI Only or Action Required?: FYI only for provider.  Patient was last seen in primary care on 07/20/2023 by Clarise Crooks, MD. Called Nurse Triage reporting Cough. Symptoms began several days ago. Interventions attempted: Other: gargling salt water  for sore throat. Symptoms are: gradually worsening.  Triage Disposition: See HCP Within 4 Hours (Or PCP Triage)  Patient/caregiver understands and will follow disposition?: Yes      Copied from CRM [id number]. Topic: Clinical - Red Word Triage >> Sep 16, 2023  7:59 AM Hamp Levine R wrote: Red Word that prompted transfer to Nurse Triage: Patient states has not been feeling well since Saturday. Has a dry cough, fever, sore throat, headache, congestion. Reason for Disposition  Wheezing is present  Answer Assessment - Initial Assessment Questions 1. ONSET: When did the cough begin?      Saturday night 2. SEVERITY: How bad is the cough today?      Patient states worst symptom is the cough 3. SPUTUM: Describe the color of your sputum (none, dry cough; clear, white, yellow, green)     Unable to have productive cough 5. DIFFICULTY BREATHING: Are you having difficulty breathing? If Yes, ask: How bad is it? (e.g., mild, moderate, severe)    - MILD: No SOB at rest, mild SOB with walking, speaks normally in sentences, can lie down, no retractions, pulse < 100.    - MODERATE: SOB at rest, SOB with minimal exertion and prefers to sit, cannot lie down flat, speaks in phrases, mild retractions, audible wheezing, pulse 100-120.    - SEVERE: Very SOB at rest, speaks in single words, struggling to breathe, sitting hunched forward, retractions, pulse > 120      No 6. FEVER: Do you have a fever? If Yes, ask: What is your temperature, how was it measured, and when did it start?     No recorded fever but chills and sweating 7. CARDIAC HISTORY: Do you have any history of heart disease? (e.g., heart attack, congestive heart failure)      No 8. LUNG  HISTORY: Do you have any history of lung disease?  (e.g., pulmonary embolus, asthma, emphysema)     No 10. OTHER SYMPTOMS: Do you have any other symptoms? (e.g., runny nose, wheezing, chest pain)       Sinus pain in face, headache, feels small rattling in chest when breathing  Denies wheezing  Protocols used: Cough - Acute Non-Productive-A-AH

## 2023-09-16 NOTE — Telephone Encounter (Signed)
 Noted  Pt has a appt.  KP

## 2023-09-25 NOTE — Progress Notes (Signed)
  ANALYSIS AND PLAN:  This is a 58 y.o. male with a family history positive for prostate cancer who returns to clinic today for follow-up of his BPH/lower urinary tract symptoms as well as erectile dysfunction. I reviewed his urinary symptoms and post residual in clinic today. His postvoid residual is slightly increased from before at 70 mL.  I reviewed with the patient his urinary symptoms and postvoid residual in clinic today.  We reviewed his response with daily Cialis medication.  I again provided some reminders regarding behavioral modifications for voiding hygiene.  We reviewed his most recent PSA testing from his outside testing.  He is scheduled to have repeat testing later this fall with his primary care physician.  We reviewed his erectile dysfunction complaints and treatment with Cialis.  Given his complaint of situational voiding issues, we discussed the utility of a pelvic floor physical therapy referral to assess for some mild dysfunction which may be affecting his more recent urinary bother.  The patient is open to this suggestion and a pelvic floor physical therapy referral was placed.  The patient will otherwise return to clinic in 12- 15 months.  We would be happy to see him back anytime sooner if problems arise.   CHIEF COMPLAINT:  History of BPH, lower urinary tract symptoms and erectile dysfunction, here for follow up.  BRIEF HISTORY OF PRESENT ILLNESS:  Garrett Barnes is a very pleasant 58 y.o. male with a history of BPH, lower urinary tract symptoms and erectile dysfunction who is previously been seen by our former partner Dr. Ike who now returns to clinic today for follow-up.  Since he was last seen, the patient notes that his voiding complaints are relatively good on his current regimen of daily Cialis medication which currently also helps treat his erectile dysfunction complaints.  He is tolerating this without difficulties.  The patient states that he does have some increased bother  with urination particularly when he is an active and able to be more preoccupied thinking about his voiding or in certain situations such as when he is out in public.  He continues to mainly complain of nocturia x 1.  He denies any dysuria, hematuria or urinary tract infections.  PAST MEDICAL HISTORY, SURGICAL HISTORY, FAMILY HISTORY, SOCIAL HISTORY, MEDICATIONS AND ALLERGIES:  Otherwise, unchanged from before.  REVIEW OF SYSTEMS:  Completed by the patient and reviewed by me today.  All pertinent positives discussed in history present illness.  PHYSICAL EXAMINATION: GENERAL:  Stable.  No apparent distress.    DRE: deferred  LABORATORY TESTING:    02/04/2018 PVR 0 cc 02/04/2018 AUA symptom score total of 14 with bother 3  06/23/2019 PVR-78 ml 06/23/2019 AUA symptom score total of 14 with bother of 4 and leakage of 0  08/27/2020 AUA SS 24 and bother of 6  08/27/2020 PVR 0 ml  07/07/2022 AUA SS 13 with bother of 3 07/07/2022 PVR 0 ml  09/28/2023 AUA SS 15 with bother of 2- mostly satisfied and leakage of 0 09/28/2023 PVR 76 ml   10/2022 OSH PSA 0.8 06/23/2019 PSA 0.97  02/04/2018 PSA 0.93  [id number] PSA 1.03

## 2023-11-11 ENCOUNTER — Ambulatory Visit: Admitting: Student

## 2023-11-11 ENCOUNTER — Encounter: Payer: Self-pay | Admitting: Student

## 2023-11-11 VITALS — BP 118/74 | HR 72 | Ht 68.0 in | Wt 184.4 lb

## 2023-11-11 DIAGNOSIS — E785 Hyperlipidemia, unspecified: Secondary | ICD-10-CM | POA: Insufficient documentation

## 2023-11-11 DIAGNOSIS — E782 Mixed hyperlipidemia: Secondary | ICD-10-CM

## 2023-11-11 DIAGNOSIS — F32A Depression, unspecified: Secondary | ICD-10-CM | POA: Insufficient documentation

## 2023-11-11 DIAGNOSIS — F419 Anxiety disorder, unspecified: Secondary | ICD-10-CM

## 2023-11-11 DIAGNOSIS — Z8042 Family history of malignant neoplasm of prostate: Secondary | ICD-10-CM

## 2023-11-11 DIAGNOSIS — N401 Enlarged prostate with lower urinary tract symptoms: Secondary | ICD-10-CM

## 2023-11-11 DIAGNOSIS — J302 Other seasonal allergic rhinitis: Secondary | ICD-10-CM | POA: Diagnosis not present

## 2023-11-11 DIAGNOSIS — I8390 Asymptomatic varicose veins of unspecified lower extremity: Secondary | ICD-10-CM | POA: Insufficient documentation

## 2023-11-11 DIAGNOSIS — N138 Other obstructive and reflux uropathy: Secondary | ICD-10-CM

## 2023-11-11 DIAGNOSIS — J309 Allergic rhinitis, unspecified: Secondary | ICD-10-CM | POA: Insufficient documentation

## 2023-11-11 MED ORDER — SERTRALINE HCL 25 MG PO TABS: 12.5000 mg | ORAL_TABLET | Freq: Every day | ORAL | 1 refills | Status: DC

## 2023-11-11 NOTE — Assessment & Plan Note (Addendum)
 Currently on Cialis 5 mg daily, managed by urology. Scheduled for pelvic floor therapy in October.

## 2023-11-11 NOTE — Assessment & Plan Note (Addendum)
 Primarily of the left calf, he denies pain, swelling, redness, or claudication symptoms. No significant edema on exam. He is on his feet all day for work. Discussed compression and elevation of legs.

## 2023-11-11 NOTE — Assessment & Plan Note (Signed)
 Father with history of prostate cancer. PSA last year was normal. Will repeat at upcoming physical.

## 2023-11-11 NOTE — Progress Notes (Signed)
 Established Patient Office Visit  Subjective   Patient ID: Garrett Barnes, male    DOB: 1966-03-23  Age: 58 y.o. MRN: [id number]  Chief Complaint  Patient presents with   Anxiety   Depression   Garrett Barnes presents today for transfer of care from Dr. Joshua who recently retired.   Reports increased work stressors and feels mood is more irritable due to this. Feels mood is good outside of this. Also notes varicosities of lower extremities that are asymptomatic. Please refer to problem based charting for further details and assessment and plan of current problem and chronic medical conditions.   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 09/11/2015: Received from: [facility] Received Sig: Take by mouth.   tadalafil (CIALIS) 5 MG tablet Take 5 mg by mouth. 03/05/2016: Received from: [facility] Received Sig: Take 1 tablet (5 mg total) by mouth daily as needed for erectile dysfunction.   [DISCONTINUED] montelukast  (SINGULAIR ) 10 MG tablet TAKE ONE (1) TABLET (10 MG TOTAL) BY MOUTH AT BEDTIME.    [DISCONTINUED] sertraline  (ZOLOFT ) 25 MG tablet Take 0.5 tablets (12.5 mg total) by mouth daily.    sertraline  (ZOLOFT ) 25 MG tablet Take 0.5 tablets (12.5 mg total) by mouth daily.    No facility-administered encounter medications on file as of 11/11/2023.    BP 118/74   Pulse 72   Ht 5' 8 (1.727 m)   Wt 184 lb 6 oz (83.6 kg)   SpO2 98%   BMI 28.03 kg/m   BP Readings from Last 3 Encounters:  11/11/23 118/74  09/16/23 128/82  07/20/23 118/80    Physical Exam Constitutional:      Appearance: Normal appearance.  HENT:     Head: Normocephalic and atraumatic.     Right Ear: Tympanic membrane normal.     Left Ear: Tympanic membrane normal.     Mouth/Throat:     Mouth: Mucous membranes are moist.     Pharynx: Oropharynx is clear.  Eyes:     Extraocular Movements: Extraocular movements intact.     Conjunctiva/sclera: Conjunctivae normal.     Pupils: Pupils are equal, round, and reactive to light.  Cardiovascular:     Rate and Rhythm: Normal rate and regular rhythm.     Pulses: Normal pulses.     Heart sounds: No murmur heard. Pulmonary:     Effort: Pulmonary effort is normal.     Breath sounds: No rhonchi or rales.  Abdominal:     General: Abdomen is flat. Bowel sounds are normal. There is no distension.     Palpations: Abdomen is soft.     Tenderness: There is no abdominal tenderness.  Musculoskeletal:        General: No swelling. Normal range of motion.  Skin:    General: Skin is warm and dry.     Capillary Refill: Capillary refill takes less than 2 seconds.     Comments: Varicosis of  the left calf, no LE edema, redness, or leg size discrepancy.   Neurological:     General: No focal deficit present.     Mental Status: He is alert and oriented to person, place, and time.  Psychiatric:        Mood and Affect: Mood normal.        Behavior: Behavior normal.        11/11/2023    9:03 AM 07/20/2023    1:19 PM 11/09/2022    9:28 AM  Depression screen PHQ 2/9  Decreased Interest 1 0 0  Down, Depressed, Hopeless 1 0 0  PHQ - 2 Score 2 0 0  Altered sleeping 2 2 0  Tired, decreased energy 1 2 0  Change in appetite 0 0 0  Feeling bad or failure about yourself  0 0 0  Trouble concentrating 1 2 0  Moving slowly or fidgety/restless 1 2 0  Suicidal thoughts 0 0 0  PHQ-9 Score 7 8 0  Difficult doing work/chores Somewhat  difficult Not difficult at all Not difficult at all       11/11/2023    9:03 AM 07/20/2023    1:19 PM 11/09/2022    9:28 AM 09/10/2022    7:59 AM  GAD 7 : Generalized Anxiety Score  Nervous, Anxious, on Edge 2 1 0 0  Control/stop worrying 1 0 0 0  Worry too much - different things 2 1 0 0  Trouble relaxing 1 1 0 0  Restless 0 1 0 0  Easily annoyed or irritable 1 1 0 0  Afraid - awful might happen 0 1 0 0  Total GAD 7 Score 7 6 0 0  Anxiety Difficulty Somewhat difficult Somewhat difficult Not difficult at all Not difficult at all    No results found for any visits on 11/11/23.  Last CBC Lab Results  Component Value Date   WBC 6.9 11/04/2020   HGB 16.7 11/04/2020   HCT 48.3 11/04/2020   MCV 85 11/04/2020   MCH 29.5 11/04/2020   RDW 12.6 11/04/2020   PLT 172 11/04/2020   Last metabolic panel Lab Results  Component Value Date   GLUCOSE 97 11/11/2022   NA 138 11/11/2022   K 4.0 11/11/2022   CL 102 11/11/2022   CO2 22 11/11/2022   BUN 8 11/11/2022   CREATININE 1.03 11/11/2022   EGFR 86 11/11/2022   CALCIUM 8.8 11/11/2022   PHOS 3.4 11/04/2020   PROT 6.7 11/11/2022   ALBUMIN 4.2 11/11/2022   LABGLOB 2.5 11/11/2022   AGRATIO 1.7 11/05/2021   BILITOT 1.5 (H) 11/11/2022   ALKPHOS 102 11/11/2022   AST 23 11/11/2022   ALT 25 11/11/2022   Last lipids Lab Results  Component Value Date   CHOL 177 11/11/2022   HDL 49 11/11/2022   LDLCALC 100 (H) 11/11/2022   TRIG 159 (H) 11/11/2022   CHOLHDL 4.4 11/08/2017   Last hemoglobin A1c No results found for: HGBA1C    The 10-year ASCVD risk score (Arnett DK, et al., 2019) is: 4.7%    Assessment & Plan:  Moderate mixed hyperlipidemia not requiring statin therapy Assessment & Plan: Lipid Panel     Component Value Date/Time   CHOL 177 11/11/2022 0807   TRIG 159 (H) 11/11/2022 0807   HDL 49 11/11/2022 0807   CHOLHDL 4.4 11/08/2017 1026   LDLCALC 100 (H) 11/11/2022 0807   LABVLDL 28 11/11/2022 0807   ASCVD risk is  low at 4.7%. Discussed diet  and exercise modification, will obtain fasting lipid panel at next visit.    Anxiety and depression Assessment & Plan: Reports increase irritably and stress typically related work or BPH. PHQ9 of 7 and GAD of 7 today. 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 Also has some trouble falling asleep about once a week due to perseveration on anxious thoughts. Discussed sleep hygiene and getting out of bed to do a relaxing activity such as reading or listening to music if having trouble falling asleep.  Continue zoloft  12.5 mg daily and monitor mood.   Orders: -     Sertraline  HCl; Take 0.5 tablets (12.5 mg total) by mouth daily.  Dispense: 45 tablet; Refill: 1  Seasonal allergic rhinitis, unspecified trigger Assessment & Plan: Reports allergies to molds and pollens. Typically more symptoms during winter and spring with pollen . Has been on Singulair  10 mg daily for about 2 years for this. Feels like mood has been more irritable on this and would like to try discontinuing this medication. Typically zyrtec as needed for nasal congestion and rhinitis. -discontinue montelukast  -zytrec daily during seasonal episodes and as needed otherwise -nasal irrigation with distilled water  -Intranasal corticosteroids as needed    Benign localized hyperplasia of prostate with urinary obstruction Assessment & Plan: Currently on Cialis 5 mg daily, managed by urology. Scheduled for pelvic floor therapy in October.    Family history of prostate cancer Assessment & Plan: Father with history of prostate cancer. PSA last year was normal. Will repeat at upcoming physical.    Varicose veins of calf Assessment & Plan: Primarily of the left calf, he denies pain, swelling, redness, or claudication symptoms. No significant edema on exam. He is on his feet all day for work. Discussed compression and elevation of legs.        Return in about 2 months (around 01/11/2024) for physical.    Harlene Saddler, MD

## 2023-11-11 NOTE — Assessment & Plan Note (Addendum)
 Reports increase irritably and stress typically related work or BPH. PHQ9 of 7 and GAD of 7 today. 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 Also has some trouble falling asleep about once a week due to perseveration on anxious thoughts. Discussed sleep hygiene and getting out of bed to do a relaxing activity such as reading or listening to music if having trouble falling asleep.  Continue zoloft  12.5 mg daily and monitor mood.

## 2023-11-11 NOTE — Assessment & Plan Note (Addendum)
 Reports allergies to molds and pollens. Typically more symptoms during winter and spring with pollen . Has been on Singulair  10 mg daily for about 2 years for this. Feels like mood has been more irritable on this and would like to try discontinuing this medication. Typically zyrtec as needed for nasal congestion and rhinitis. -discontinue montelukast  -zytrec daily during seasonal episodes and as needed otherwise -nasal irrigation with distilled water  -Intranasal corticosteroids as needed

## 2023-11-11 NOTE — Assessment & Plan Note (Addendum)
 Lipid Panel     Component Value Date/Time   CHOL 177 11/11/2022 0807   TRIG 159 (H) 11/11/2022 0807   HDL 49 11/11/2022 0807   CHOLHDL 4.4 11/08/2017 1026   LDLCALC 100 (H) 11/11/2022 0807   LABVLDL 28 11/11/2022 0807   ASCVD risk is low at 4.7%. Discussed diet and exercise modification, will obtain fasting lipid panel at next visit.

## 2024-01-17 ENCOUNTER — Ambulatory Visit: Payer: Self-pay

## 2024-01-17 NOTE — Telephone Encounter (Signed)
 FYI Only or Action Required?: FYI only for provider.  Patient was last seen in primary care on 11/11/2023 by Lemon Raisin, MD.  Called Nurse Triage reporting Dizziness and Headache.  Symptoms began several weeks ago.  Interventions attempted: OTC medications: ibuprofen and Rest, hydration, or home remedies.  Symptoms are: gradually worsening.  Triage Disposition: See Physician Within 24 Hours  Patient/caregiver understands and will follow disposition?: Yes                            Copied from CRM #[id number]. Topic: Clinical - Red Word Triage >> Jan 17, 2024  4:18 PM Myrick T wrote: Red Word that prompted transfer to Nurse Triage: patient called stating he has been dizzy for about 3 weeks mostly when standing/walking. He was in an establishment and there was a lot of moving around and he got dizzy. He also notice some skin modeling on his arms and tenderness on the right side of head. Reason for Disposition  [1] MODERATE dizziness (e.g., interferes with normal activities) AND [2] has NOT been evaluated by doctor (or NP/PA) for this  (Exception: Dizziness caused by heat exposure, sudden standing, or poor fluid intake.)  Answer Assessment - Initial Assessment Questions 1. DESCRIPTION: Describe your dizziness.     Woozy/lightheaded sensation that onsets with screen time and stress  2. LIGHTHEADED: Do you feel lightheaded? (e.g., somewhat faint, woozy, weak upon standing)     Yes, denies fainting/falling 3. VERTIGO: Do you feel like either you or the room is spinning or tilting? (i.e., vertigo)     Denies, states this has only happened 1 x since he hit his head in March 4. SEVERITY: How bad is it?  Do you feel like you are going to faint? Can you stand and walk?     States he is able to walk around normally 5. ONSET:  When did the dizziness begin?     On and off for 3 weeks 6. AGGRAVATING FACTORS: Does anything make it worse? (e.g., standing,  change in head position)     Looking at screens, stress 7. HEART RATE: Can you tell me your heart rate? How many beats in 15 seconds?  (Note: Not all patients can do this.)       States his pulse rate is good 8. CAUSE: What do you think is causing the dizziness? (e.g., decreased fluids or food, diarrhea, emotional distress, heat exposure, new medicine, sudden standing, vomiting; unknown)     Unsure, states he sustained a head injury in March when he walked into a stage pipe, flew on an airplane 3 weeks ago  Patient states he abruptly stopped taking Zoloft  and Cialis 3 weeks ago 9. RECURRENT SYMPTOM: Have you had dizziness before? If Yes, ask: When was the last time? What happened that time?     Yes, states he sustained a head injury when he was 21 and in March of this year  10. OTHER SYMPTOMS: Do you have any other symptoms? (e.g., fever, chest pain, vomiting, diarrhea, bleeding)     Tenderness on right side of head near temple- states pain feels external, light-related changes in vision, denies changes in speech, denies facial drooping, denies falls/fainting, denies one-sided weakness, denies numbness Skin mottling on inside of arms that goes away after taking a shower or sleeping  Protocols used: Dizziness - Lightheadedness-A-AH

## 2024-01-18 ENCOUNTER — Ambulatory Visit: Admitting: Student

## 2024-01-18 ENCOUNTER — Encounter: Payer: Self-pay | Admitting: Student

## 2024-01-18 VITALS — BP 146/93 | HR 92 | Ht 68.0 in | Wt 187.0 lb

## 2024-01-18 DIAGNOSIS — F419 Anxiety disorder, unspecified: Secondary | ICD-10-CM | POA: Diagnosis not present

## 2024-01-18 DIAGNOSIS — R42 Dizziness and giddiness: Secondary | ICD-10-CM | POA: Diagnosis not present

## 2024-01-18 DIAGNOSIS — F32A Depression, unspecified: Secondary | ICD-10-CM

## 2024-01-18 NOTE — Progress Notes (Unsigned)
 Established Patient Office Visit  Subjective   Patient ID: Garrett Barnes, male    DOB: 30-Apr-1965  Age: 58 y.o. MRN: [id number]  Chief Complaint  Patient presents with   Dizziness    Previous injury to right side of his body, having headache/jaw pain on the right     Garrett Barnes with medical hx listed below presents today for dizziness. Abdominal pain 3 episodes in September. Pain is a cramping pain. Last episode of 9/27. Stopped taking all his medications end of September. Has not had abdominal pain.   9/29 after getting off the plane noted some mottling of the skin. Since then noticed this improves at night and with shower and then slowly returns. No other rashes of mottling elsewhere in the body. Thinks it is related to the cold.  Feels highhandedness for the last few weeks that come and goes. Notices this happens after using hte phone or the computuer, lasting about  30- minutes to an hour. Occasionally turning his head worsens symptoms but this is not consistent. Does no have headache, weakness, numbness, tingling, LOC. Does have hx of migraines with aura, does not taking anything for this and rarely has head pain.   Patient Active Problem List   Diagnosis Date Noted   Dizziness 01/21/2024   HLD (hyperlipidemia) 11/11/2023   Anxiety and depression 11/11/2023   Allergic rhinitis 11/11/2023   Varicose veins of calf 11/11/2023   History of colonic polyps    Incomplete emptying of bladder 02/03/2017   Family history of prostate cancer 02/02/2017   Benign localized hyperplasia of prostate with urinary obstruction 09/18/2013      ROS Refer to HPI    Objective:     Outpatient Encounter Medications as of 01/18/2024  Medication Sig Note   Multiple Vitamin (MULTIVITAMIN) capsule Take 1 capsule by mouth daily. 09/11/2015: Received from: [facility] Received Sig: Take by mouth.   tadalafil (CIALIS) 5 MG tablet Take 5 mg by mouth. (Patient not taking: Reported on 01/18/2024)  03/05/2016: Received from: [facility] Received Sig: Take 1 tablet (5 mg total) by mouth daily as needed for erectile dysfunction.   [DISCONTINUED] sertraline  (ZOLOFT ) 25 MG tablet Take 0.5 tablets (12.5 mg total) by mouth daily. (Patient not taking: Reported on 01/18/2024)    No facility-administered encounter medications on file as of 01/18/2024.    BP (!) 146/93 Comment: standing  Pulse 92   Ht 5' 8 (1.727 m)   Wt 187 lb (84.8 kg)   SpO2 99%   BMI 28.43 kg/m  BP Readings from Last 3 Encounters:  01/18/24 (!) 146/93  11/11/23 118/74  09/16/23 128/82    Physical Exam Constitutional:      Appearance: Normal appearance.  HENT:     Mouth/Throat:     Mouth: Mucous membranes are moist.     Pharynx: Oropharynx is clear.  Eyes:     Extraocular Movements: Extraocular movements intact.     Conjunctiva/sclera: Conjunctivae normal.     Pupils: Pupils are equal, round, and reactive to light.  Cardiovascular:     Rate and Rhythm: Normal rate and regular rhythm.  Pulmonary:     Effort: Pulmonary effort is normal.     Breath sounds: No rhonchi or rales.  Abdominal:     General: Abdomen is flat. Bowel sounds are normal. There is no distension.     Palpations: Abdomen is soft.     Tenderness: There is no abdominal tenderness.  Musculoskeletal:  General: Normal range of motion.     Right lower leg: No edema.     Left lower leg: No edema.  Skin:    General: Skin is warm and dry.     Capillary Refill: Capillary refill takes less than 2 seconds.  Neurological:     General: No focal deficit present.     Mental Status: He is alert and oriented to person, place, and time. Mental status is at baseline.     Cranial Nerves: No cranial nerve deficit.     Sensory: No sensory deficit.     Motor: No weakness.     Coordination: Coordination normal.     Gait: Gait normal.     Comments: No nystagmus  Psychiatric:        Mood and Affect: Mood normal.        Behavior: Behavior normal.         11/11/2023    9:03 AM 07/20/2023    1:19 PM 11/09/2022    9:28 AM  Depression screen PHQ 2/9  Decreased Interest 1 0 0  Down, Depressed, Hopeless 1 0 0  PHQ - 2 Score 2 0 0  Altered sleeping 2 2 0  Tired, decreased energy 1 2 0  Change in appetite 0 0 0  Feeling bad or failure about yourself  0 0 0  Trouble concentrating 1 2 0  Moving slowly or fidgety/restless 1 2 0  Suicidal thoughts 0 0 0  PHQ-9 Score 7 8 0  Difficult doing work/chores Somewhat difficult Not difficult at all Not difficult at all       11/11/2023    9:03 AM 07/20/2023    1:19 PM 11/09/2022    9:28 AM 09/10/2022    7:59 AM  GAD 7 : Generalized Anxiety Score  Nervous, Anxious, on Edge 2 1 0 0  Control/stop worrying 1 0 0 0  Worry too much - different things 2 1 0 0  Trouble relaxing 1 1 0 0  Restless 0 1 0 0  Easily annoyed or irritable 1 1 0 0  Afraid - awful might happen 0 1 0 0  Total GAD 7 Score 7 6 0 0  Anxiety Difficulty Somewhat difficult Somewhat difficult Not difficult at all Not difficult at all    No results found for any visits on 01/18/24.    The 10-year ASCVD risk score (Arnett DK, et al., 2019) is: 6.8%    Assessment & Plan:  Anxiety and depression Assessment & Plan: Has been off zoloft  for about 1 month. Not having irritability or significant mood symptoms. Monitor mood off medication.    Dizziness Assessment & Plan: Possible BPPV, dix Hallpike negative today.  Although he reports this has been helping with symptoms. Negative orthostatics today. Offered CT head given hx of of hitting the right temple/forehead in March, No LOC or neurological deficits at that time. He declined this. Possible atypical migraine although he is not having any pain. Discuss avoidance of headache triggers. Continue home epley maneuvers, follow up if sx are not improving.       No follow-ups on file.    Harlene Saddler, MD

## 2024-01-18 NOTE — Telephone Encounter (Signed)
 Noted  Pt has appt.  KP

## 2024-01-21 DIAGNOSIS — R42 Dizziness and giddiness: Secondary | ICD-10-CM | POA: Insufficient documentation

## 2024-01-21 NOTE — Assessment & Plan Note (Signed)
 Has been off zoloft  for about 1 month. Not having irritability or significant mood symptoms. Monitor mood off medication.

## 2024-01-21 NOTE — Assessment & Plan Note (Signed)
 Possible BPPV, dix Hallpike negative today.  Although he reports this has been helping with symptoms. Negative orthostatics today. Offered CT head given hx of of hitting the right temple/forehead in March, No LOC or neurological deficits at that time. He declined this. Possible atypical migraine although he is not having any pain. Discuss avoidance of headache triggers. Continue home epley maneuvers, follow up if sx are not improving.

## 2024-02-01 ENCOUNTER — Ambulatory Visit (INDEPENDENT_AMBULATORY_CARE_PROVIDER_SITE_OTHER): Admitting: Student

## 2024-02-01 ENCOUNTER — Encounter: Payer: Self-pay | Admitting: Student

## 2024-02-01 VITALS — BP 118/74 | HR 78 | Ht 68.0 in | Wt 187.0 lb

## 2024-02-01 DIAGNOSIS — E782 Mixed hyperlipidemia: Secondary | ICD-10-CM | POA: Diagnosis not present

## 2024-02-01 DIAGNOSIS — Z0000 Encounter for general adult medical examination without abnormal findings: Secondary | ICD-10-CM | POA: Insufficient documentation

## 2024-02-01 DIAGNOSIS — Z860100 Personal history of colon polyps, unspecified: Secondary | ICD-10-CM | POA: Diagnosis not present

## 2024-02-01 DIAGNOSIS — R42 Dizziness and giddiness: Secondary | ICD-10-CM | POA: Diagnosis not present

## 2024-02-01 DIAGNOSIS — R339 Retention of urine, unspecified: Secondary | ICD-10-CM

## 2024-02-01 NOTE — Assessment & Plan Note (Signed)
 Lipid panel today

## 2024-02-01 NOTE — Assessment & Plan Note (Signed)
 Much improved with hydration, new neck pillow, and blue light filtering glasses.

## 2024-02-01 NOTE — Assessment & Plan Note (Addendum)
 Family hx of prostate cancer in father at around 54, no significant urinary sx today Lab ordered Will scheduled pneumonia vaccine in future  Recently had flu and COVID vaccine

## 2024-02-01 NOTE — Progress Notes (Signed)
 Complete physical exam  Patient: Garrett Barnes   DOB: 12/25/1965   58 y.o. Male  MRN: [id number]  Subjective:    Chief Complaint  Patient presents with   Annual Exam    Garrett Barnes is a 58 y.o. male who presents today for a complete physical exam. He reports consuming a general diet. Walks 20 mins a day He generally feels well. He reports sleeping well. He does not have additional problems to discuss today.      Patient Active Problem List   Diagnosis Date Noted   Annual physical exam 02/01/2024   Dizziness 01/21/2024   HLD (hyperlipidemia) 11/11/2023   Anxiety and depression 11/11/2023   Allergic rhinitis 11/11/2023   Varicose veins of calf 11/11/2023   History of colonic polyps    Incomplete emptying of bladder 02/03/2017   Family history of prostate cancer 02/02/2017   Benign localized hyperplasia of prostate with urinary obstruction 09/18/2013      Patient Care Team: Lemon Raisin, MD as PCP - General (Internal Medicine)   Outpatient Medications Prior to Visit  Medication Sig   Multiple Vitamin (MULTIVITAMIN) capsule Take 1 capsule by mouth daily.   tadalafil (CIALIS) 5 MG tablet Take 5 mg by mouth. (Patient not taking: Reported on 02/01/2024)   No facility-administered medications prior to visit.    ROS Refer to HPI     Objective:    BP 118/74   Pulse 78   Ht 5' 8 (1.727 m)   Wt 187 lb (84.8 kg)   SpO2 99%   BMI 28.43 kg/m  BP Readings from Last 3 Encounters:  02/01/24 118/74  01/18/24 (!) 146/93  11/11/23 118/74    Physical Exam      02/01/2024    7:52 AM 11/11/2023    9:03 AM 07/20/2023    1:19 PM  Depression screen PHQ 2/9  Decreased Interest 0 1 0  Down, Depressed, Hopeless 0 1 0  PHQ - 2 Score 0 2 0  Altered sleeping  2 2  Tired, decreased energy  1 2  Change in appetite  0 0  Feeling bad or failure about yourself   0 0  Trouble concentrating  1 2  Moving slowly or fidgety/restless  1 2  Suicidal thoughts  0 0  PHQ-9 Score  7 8   Difficult doing work/chores  Somewhat difficult Not difficult at all      02/01/2024    7:52 AM 11/11/2023    9:03 AM 07/20/2023    1:19 PM 11/09/2022    9:28 AM  GAD 7 : Generalized Anxiety Score  Nervous, Anxious, on Edge 0 2 1 0  Control/stop worrying 0 1 0 0  Worry too much - different things  2 1 0  Trouble relaxing  1 1 0  Restless  0 1 0  Easily annoyed or irritable  1 1 0  Afraid - awful might happen  0 1 0  Total GAD 7 Score  7 6 0  Anxiety Difficulty  Somewhat difficult Somewhat difficult Not difficult at all    No results found for any visits on 02/01/24. Last CBC Lab Results  Component Value Date   WBC 6.9 11/04/2020   HGB 16.7 11/04/2020   HCT 48.3 11/04/2020   MCV 85 11/04/2020   MCH 29.5 11/04/2020   RDW 12.6 11/04/2020   PLT 172 11/04/2020   Last metabolic panel Lab Results  Component Value Date   GLUCOSE 97 11/11/2022   NA 138  11/11/2022   K 4.0 11/11/2022   CL 102 11/11/2022   CO2 22 11/11/2022   BUN 8 11/11/2022   CREATININE 1.03 11/11/2022   EGFR 86 11/11/2022   CALCIUM 8.8 11/11/2022   PHOS 3.4 11/04/2020   PROT 6.7 11/11/2022   ALBUMIN 4.2 11/11/2022   LABGLOB 2.5 11/11/2022   AGRATIO 1.7 11/05/2021   BILITOT 1.5 (H) 11/11/2022   ALKPHOS 102 11/11/2022   AST 23 11/11/2022   ALT 25 11/11/2022   Last lipids Lab Results  Component Value Date   CHOL 177 11/11/2022   HDL 49 11/11/2022   LDLCALC 100 (H) 11/11/2022   TRIG 159 (H) 11/11/2022   CHOLHDL 4.4 11/08/2017      Assessment & Plan:    Routine Health Maintenance and Physical Exam  Health Maintenance  Topic Date Due   Pneumococcal Vaccine for age over 91 (1 of 2 - PCV) Never done   Hepatitis B Vaccine (1 of 3 - 19+ 3-dose series) Never done   Zoster (Shingles) Vaccine (2 of 2) 01/04/2023   Flu Shot  06/27/2024*   COVID-19 Vaccine (4 - 2025-26 season) 02/02/2025*   Colon Cancer Screening  11/15/2027   DTaP/Tdap/Td vaccine (2 - Td or Tdap) 07/27/2030   HIV Screening   Completed   HPV Vaccine  Aged Out   Meningitis B Vaccine  Aged Out   Hepatitis C Screening  Discontinued  *Topic was postponed. The date shown is not the original due date.    Discussed health benefits of physical activity, and encouraged him to engage in regular exercise appropriate for his age and condition.  Dizziness Assessment & Plan: Much improved with hydration, new neck pillow, and blue light filtering glasses.    Moderate mixed hyperlipidemia not requiring statin therapy Assessment & Plan: Lipid panel today.   Orders: -     Lipid panel  History of colonic polyps Assessment & Plan: Last colonoscopy 2022 next due in 2029   Annual physical exam Assessment & Plan: Family hx of prostate cancer in father at around 66, no significant urinary sx today Lab ordered Will scheduled pneumonia vaccine in future  Recently had flu and COVID vaccine  Orders: -     Comprehensive metabolic panel with GFR -     PSA -     CBC with Differential/Platelet -     TSH  Incomplete emptying of bladder Assessment & Plan: Doing well with pelvic therapy.    Return in about 1 year (around 01/31/2025) for physical.     Harlene Saddler, MD

## 2024-02-01 NOTE — Assessment & Plan Note (Signed)
 Doing well with pelvic therapy.

## 2024-02-01 NOTE — Assessment & Plan Note (Signed)
 Last colonoscopy 2022 next due in 2029

## 2024-02-02 ENCOUNTER — Ambulatory Visit: Payer: Self-pay | Admitting: Student

## 2024-02-02 LAB — CBC WITH DIFFERENTIAL/PLATELET
Basophils Absolute: 0 x10E3/uL (ref 0.0–0.2)
Basos: 0 %
EOS (ABSOLUTE): 0.3 x10E3/uL (ref 0.0–0.4)
Eos: 5 %
Hematocrit: 50.1 % (ref 37.5–51.0)
Hemoglobin: 16.5 g/dL (ref 13.0–17.7)
Immature Grans (Abs): 0 x10E3/uL (ref 0.0–0.1)
Immature Granulocytes: 0 %
Lymphocytes Absolute: 0.9 x10E3/uL (ref 0.7–3.1)
Lymphs: 15 %
MCH: 29.7 pg (ref 26.6–33.0)
MCHC: 32.9 g/dL (ref 31.5–35.7)
MCV: 90 fL (ref 79–97)
Monocytes Absolute: 0.5 x10E3/uL (ref 0.1–0.9)
Monocytes: 9 %
Neutrophils Absolute: 4.4 x10E3/uL (ref 1.4–7.0)
Neutrophils: 71 %
Platelets: 166 x10E3/uL (ref 150–450)
RBC: 5.56 x10E6/uL (ref 4.14–5.80)
RDW: 12.5 % (ref 11.6–15.4)
WBC: 6.2 x10E3/uL (ref 3.4–10.8)

## 2024-02-02 LAB — LIPID PANEL
Chol/HDL Ratio: 3.4 ratio (ref 0.0–5.0)
Cholesterol, Total: 178 mg/dL (ref 100–199)
HDL: 52 mg/dL (ref >39)
LDL Chol Calc (NIH): 108 mg/dL — ABNORMAL HIGH (ref 0–99)
Triglycerides: 100 mg/dL (ref 0–149)
VLDL Cholesterol Cal: 18 mg/dL (ref 5–40)

## 2024-02-02 LAB — COMPREHENSIVE METABOLIC PANEL WITH GFR
ALT: 35 IU/L (ref 0–44)
AST: 29 IU/L (ref 0–40)
Albumin: 4.6 g/dL (ref 3.8–4.9)
Alkaline Phosphatase: 90 IU/L (ref 47–123)
BUN/Creatinine Ratio: 13 (ref 9–20)
BUN: 13 mg/dL (ref 6–24)
Bilirubin Total: 1.8 mg/dL — ABNORMAL HIGH (ref 0.0–1.2)
CO2: 24 mmol/L (ref 20–29)
Calcium: 9.6 mg/dL (ref 8.7–10.2)
Chloride: 102 mmol/L (ref 96–106)
Creatinine, Ser: 0.98 mg/dL (ref 0.76–1.27)
Globulin, Total: 3 g/dL (ref 1.5–4.5)
Glucose: 95 mg/dL (ref 70–99)
Potassium: 4.9 mmol/L (ref 3.5–5.2)
Sodium: 140 mmol/L (ref 134–144)
Total Protein: 7.6 g/dL (ref 6.0–8.5)
eGFR: 91 mL/min/1.73 (ref >59)

## 2024-02-02 LAB — PSA: Prostate Specific Ag, Serum: 1.1 ng/mL (ref 0.0–4.0)

## 2024-02-02 LAB — TSH: TSH: 1.29 u[IU]/mL (ref 0.450–4.500)

## 2024-02-22 ENCOUNTER — Ambulatory Visit: Payer: Self-pay

## 2024-02-22 NOTE — Telephone Encounter (Signed)
 FYI Only or Action Required?: FYI only for provider: appointment scheduled on 02/23/24.  Patient was last seen in primary care on 02/01/2024 by Lemon Raisin, MD.  Called Nurse Triage reporting Appointment and Dizziness.  Symptoms began several weeks ago.  Interventions attempted: Rest, hydration, or home remedies and Dietary changes.  Symptoms are: unchanged.  Triage Disposition: See PCP When Office is Open (Within 3 Days)  Patient/caregiver understands and will follow disposition?: Yes   Copied from CRM #[id number]. Topic: Clinical - Red Word Triage >> Feb 22, 2024  2:35 PM Kevelyn M wrote: Red Word that prompted transfer to Nurse Triage: Last two months light headness/dizziness and little vertigo, weak, bruising easily, skin modeling. Had a fever from Saturday. Reason for Disposition  [1] MODERATE dizziness (e.g., interferes with normal activities) AND [2] has been evaluated by doctor (or NP/PA) for this  Answer Assessment - Initial Assessment Questions 1. DESCRIPTION: Describe your dizziness.     Lightheaded  2. LIGHTHEADED: Do you feel lightheaded? (e.g., somewhat faint, woozy, weak upon standing)     lightheaded 3. VERTIGO: Do you feel like either you or the room is spinning or tilting? (i.e., vertigo)     yes 4. SEVERITY: How bad is it?  Do you feel like you are going to faint? Can you stand and walk?     Persisting does not feel like he will faint 5. ONSET:  When did the dizziness begin?     One month 6. AGGRAVATING FACTORS: Does anything make it worse? (e.g., standing, change in head position)      7. HEART RATE: Can you tell me your heart rate? How many beats in 15 seconds?  (Note: Not all patients can do this.)        no 8. CAUSE: What do you think is causing the dizziness? (e.g., decreased fluids or food, diarrhea, emotional distress, heat exposure, new medicine, sudden standing, vomiting; unknown)     unsure 9. RECURRENT SYMPTOM: Have you had  dizziness before? If Yes, ask: When was the last time? What happened that time?     yes 10. OTHER SYMPTOMS: Do you have any other symptoms? (e.g., fever, chest pain, vomiting, diarrhea, bleeding)       Skin mottling, Saturday fever 100.0, arm sore from pneumonia vaccine on Friday bruising at injection site. Fatigue 11. PREGNANCY: Is there any chance you are pregnant? When was your last menstrual period?  Protocols used: Dizziness - Lightheadedness-A-AH

## 2024-02-22 NOTE — Telephone Encounter (Signed)
 FYI

## 2024-02-23 ENCOUNTER — Ambulatory Visit: Admitting: Student

## 2024-02-23 ENCOUNTER — Encounter: Payer: Self-pay | Admitting: Student

## 2024-02-23 VITALS — BP 134/82 | HR 73 | Temp 98.6°F | Ht 68.0 in | Wt 188.0 lb

## 2024-02-23 DIAGNOSIS — J069 Acute upper respiratory infection, unspecified: Secondary | ICD-10-CM

## 2024-02-23 NOTE — Progress Notes (Signed)
 Established Patient Office Visit  Subjective   Patient ID: Garrett Barnes, male    DOB: 04-Nov-1965  Age: 58 y.o. MRN: [id number]  Chief Complaint  Patient presents with   Dizziness   Fever    Got pneumonia shot on Friday, woke up Saturday with a fever    Garrett Barnes is a 58 y.o. person with medical hx listed below who presents today for fever and cough that started about 5 days ago.  Had pneumonia vaccine on 11/21, woke up that night with temp of 54F. Felt malaise over the weekend with associated arm pain that has gradually improved. Started having sore throat Monday and Tuesday and cough this morning. Small amount of yellow sputum with cough. Some rhinorrhea without sinus pain. Feeling more tired and intermittently dizzy in the morning that improves with rest. Has been taking suedafed, and cough suppressant with mild improvement. 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 Denies chills, dyspnea, chest pain, abdominal pain n/v/d, ear pain, LOC, weakness, inability to tolerate PO intake.   Patient Active Problem List   Diagnosis Date Noted   Annual physical exam 02/01/2024   Dizziness 01/21/2024   HLD (hyperlipidemia) 11/11/2023   Anxiety and depression 11/11/2023   Allergic rhinitis 11/11/2023   Varicose veins of calf 11/11/2023   History of colonic polyps    Incomplete emptying of bladder 02/03/2017   Family history of prostate cancer 02/02/2017   Benign localized hyperplasia of prostate with urinary obstruction 09/18/2013    ROS Refer to HPI    Objective:     Outpatient Encounter Medications as of 02/23/2024  Medication Sig Note   Multiple Vitamin (MULTIVITAMIN) capsule Take 1 capsule by mouth daily. 09/11/2015: Received from: [facility] Received Sig: Take by mouth.   tadalafil (CIALIS) 5 MG tablet Take 5 mg by mouth. (Patient not taking: Reported on 02/23/2024) 03/05/2016: Received from: [facility] Received Sig: Take 1 tablet (5 mg total) by mouth daily  as needed for erectile dysfunction.   No facility-administered encounter medications on file as of 02/23/2024.    BP 134/82   Pulse 73   Temp 98.6 F (37 C)   Ht 5' 8 (1.727 m)   Wt 188 lb (85.3 kg)   SpO2 98%   BMI 28.59 kg/m  BP Readings from Last 3 Encounters:  02/23/24 134/82  02/01/24 118/74  01/18/24 (!) 146/93    Physical Exam Constitutional:      Appearance: Normal appearance.  HENT:     Head: Normocephalic and atraumatic.     Right Ear: Tympanic membrane and ear canal normal.     Left Ear: Tympanic membrane and ear canal normal.     Nose: Rhinorrhea present. No congestion.     Right Turbinates: Not swollen.     Left Turbinates: Not swollen.     Right Sinus: No maxillary sinus tenderness or frontal sinus tenderness.     Left Sinus: No maxillary sinus tenderness or frontal sinus tenderness.     Mouth/Throat:     Mouth: Mucous membranes are moist.     Pharynx: Oropharynx is clear. No oropharyngeal exudate or posterior oropharyngeal erythema.  Eyes:     Extraocular Movements: Extraocular movements intact.     Conjunctiva/sclera: Conjunctivae normal.     Pupils: Pupils are equal, round, and reactive to light.  Cardiovascular:     Rate and Rhythm: Normal rate and regular rhythm.  Pulmonary:     Effort: Pulmonary effort is normal.  Breath sounds: No rhonchi or rales.  Abdominal:     General: Abdomen is flat. Bowel sounds are normal. There is no distension.     Palpations: Abdomen is soft.     Tenderness: There is no abdominal tenderness.  Musculoskeletal:        General: Normal range of motion.     Right lower leg: No edema.     Left lower leg: No edema.  Skin:    General: Skin is warm and dry.     Capillary Refill: Capillary refill takes less than 2 seconds.  Neurological:     General: No focal deficit present.     Mental Status: He is alert and oriented to person, place, and time.  Psychiatric:        Mood and Affect: Mood normal.        Behavior:  Behavior normal.        02/23/2024    1:17 PM 02/01/2024    7:52 AM 11/11/2023    9:03 AM  Depression screen PHQ 2/9  Decreased Interest 0 0 1  Down, Depressed, Hopeless 0 0 1  PHQ - 2 Score 0 0 2  Altered sleeping   2  Tired, decreased energy   1  Change in appetite   0  Feeling bad or failure about yourself    0  Trouble concentrating   1  Moving slowly or fidgety/restless   1  Suicidal thoughts   0  PHQ-9 Score   7   Difficult doing work/chores   Somewhat difficult     Data saved with a previous flowsheet row definition       02/23/2024    1:17 PM 02/01/2024    7:52 AM 11/11/2023    9:03 AM 07/20/2023    1:19 PM  GAD 7 : Generalized Anxiety Score  Nervous, Anxious, on Edge 0 0 2 1  Control/stop worrying 0 0 1 0  Worry too much - different things   2 1  Trouble relaxing   1 1  Restless   0 1  Easily annoyed or irritable   1 1  Afraid - awful might happen   0 1  Total GAD 7 Score   7 6  Anxiety Difficulty   Somewhat difficult Somewhat difficult    No results found for any visits on 02/23/24.  Last CBC Lab Results  Component Value Date   WBC 6.2 02/01/2024   HGB 16.5 02/01/2024   HCT 50.1 02/01/2024   MCV 90 02/01/2024   MCH 29.7 02/01/2024   RDW 12.5 02/01/2024   PLT 166 02/01/2024   Last metabolic panel Lab Results  Component Value Date   GLUCOSE 95 02/01/2024   NA 140 02/01/2024   K 4.9 02/01/2024   CL 102 02/01/2024   CO2 24 02/01/2024   BUN 13 02/01/2024   CREATININE 0.98 02/01/2024   EGFR 91 02/01/2024   CALCIUM 9.6 02/01/2024   PHOS 3.4 11/04/2020   PROT 7.6 02/01/2024   ALBUMIN 4.6 02/01/2024   LABGLOB 3.0 02/01/2024   AGRATIO 1.7 11/05/2021   BILITOT 1.8 (H) 02/01/2024   ALKPHOS 90 02/01/2024   AST 29 02/01/2024   ALT 35 02/01/2024    The 10-year ASCVD risk score (Arnett DK, et al., 2019) is: 5.6%    Assessment & Plan:  Acute URI Likely viral illness, reports coworker has had a cold. Did recently get a pneumonia vaccine could be  2/2 response to recent vaccination. Discussed increase PO  hydration, sinus irrigation, guaifenesin  to thin secretions.  If symptoms are worsening, develop fever, or persistent for more than 1 week he will message me.   Return if symptoms worsen or fail to improve.    Harlene Saddler, MD

## 2025-02-07 ENCOUNTER — Encounter: Admitting: Student
